# Patient Record
Sex: Male | Born: 1950
Health system: Southern US, Community
[De-identification: ages and names within clinical notes are randomized; demographics above are authoritative.]

## PROBLEM LIST (undated history)

## (undated) DIAGNOSIS — Z8619 Personal history of other infectious and parasitic diseases: Secondary | ICD-10-CM

## (undated) DIAGNOSIS — D580 Hereditary spherocytosis: Secondary | ICD-10-CM

## (undated) HISTORY — DX: Personal history of other infectious and parasitic diseases: Z86.19

## (undated) HISTORY — DX: Hereditary spherocytosis: D58.0

## (undated) HISTORY — PX: SPLENECTOMY: SUR1306

---

## 2005-04-13 HISTORY — PX: COLONOSCOPY: SHX174

## 2014-04-16 ENCOUNTER — Encounter: Payer: Self-pay | Admitting: Family Medicine

## 2014-04-16 ENCOUNTER — Ambulatory Visit (INDEPENDENT_AMBULATORY_CARE_PROVIDER_SITE_OTHER): Payer: Self-pay | Admitting: Family Medicine

## 2014-04-16 VITALS — BP 118/78 | HR 84 | Temp 97.7°F | Ht 67.75 in | Wt 170.5 lb

## 2014-04-16 DIAGNOSIS — D689 Coagulation defect, unspecified: Secondary | ICD-10-CM

## 2014-04-16 DIAGNOSIS — D580 Hereditary spherocytosis: Secondary | ICD-10-CM | POA: Insufficient documentation

## 2014-04-16 NOTE — Assessment & Plan Note (Signed)
Will review records when they arrive from prior PCP [facility] at [facility]

## 2014-04-16 NOTE — Patient Instructions (Addendum)
Sign release for records from [place] [facility] Good to meet you today, call us with questions. Return as needed or after September for next physical.

## 2014-04-16 NOTE — Progress Notes (Signed)
BP 118/78 mmHg  Pulse 84  Temp(Src) 97.7 F (36.5 C) (Oral)  Ht 5' 7.75" (1.721 m)  Wt 170 lb 8 oz (77.338 kg)  BMI 26.11 kg/m2   CC: new pt to establish  Subjective:    Patient ID: Dustin Cole, male    DOB: 06/29/1950, 64 y.o.   MRN: [id number]  HPI: Dustin Cole is a 64 y.o. male presenting on 04/16/2014 for Establish Care   Prior saw Dr Farrel Gordon at [place]. Comes to establish care today as this is closer to home.  H/o some hereditary clotting disorder s/p splenectomy as a teenager. ?hereditary spherocytosis - will await records. Denies bleeding troubles currently. Father's side with similar hx, brother with same dx also s/p splenectomy.  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 Interim medical history since our last visit reviewed. Allergies and medications reviewed and updated. No current outpatient prescriptions on file prior to visit.   No current facility-administered medications on file prior to visit.    Review of Systems  Constitutional: Negative for fever, chills, appetite change, fatigue and unexpected weight change.  Respiratory: Negative for cough, chest tightness, shortness of breath and wheezing.   Cardiovascular: Negative for chest pain, palpitations and leg swelling.   Per HPI unless specifically indicated above     Objective:    BP 118/78 mmHg  Pulse 84  Temp(Src) 97.7 F (36.5 C) (Oral)  Ht 5' 7.75" (1.721 m)  Wt 170 lb 8 oz (77.338 kg)  BMI 26.11 kg/m2  Wt Readings from Last 3 Encounters:  04/16/14 170 lb 8 oz (77.338 kg)    Physical Exam  Constitutional: He is oriented to person, place,  and time. He appears well-developed and well-nourished. No distress.  HENT:  Head: Normocephalic and atraumatic.  Right Ear: Hearing, tympanic membrane, external ear and ear canal normal.  Left Ear: Hearing, tympanic membrane, external ear and ear canal normal.  Nose: Nose normal.  Mouth/Throat: Uvula is midline, oropharynx is clear and moist and mucous membranes are normal. No oropharyngeal exudate, posterior oropharyngeal edema or posterior oropharyngeal erythema.  Eyes: Conjunctivae and EOM are normal. Pupils are equal, round, and reactive to light. No scleral icterus.  Neck: Normal range of motion. Neck supple.  Cardiovascular: Normal rate, regular rhythm, normal heart sounds and intact distal pulses.   No murmur heard. Pulses:      Radial pulses are 2+ on the right side, and 2+ on the left side.  Pulmonary/Chest: Effort normal and breath sounds normal. No respiratory distress. He has no wheezes. He has no rales.  Musculoskeletal: Normal range of motion. He exhibits no edema.  Lymphadenopathy:    He has no cervical adenopathy.  Neurological: He is alert and oriented to person, place, and time.  CN grossly intact, station and gait intact  Skin: Skin is warm and dry. No rash noted.  Psychiatric: He has a normal mood and affect. His behavior is normal. Judgment and thought content normal.  Nursing note and vitals reviewed.  No results found for this or any previous visit.    Assessment & Plan:  Discussed meds/supplements, rec against extra calcium supplementation, discussed dietary calcium supplementation.  Problem List  Items Addressed This Visit    Blood clotting disorder - Primary    Will review records when they arrive from prior PCP [facility] at [facility]        Follow up plan: Return in about 9 months (around 01/15/2015), or as needed, for annual exam, prior fasting for blood work.

## 2014-04-16 NOTE — Progress Notes (Signed)
Pre visit review using our clinic review tool, if applicable. No additional management support is needed unless otherwise documented below in the visit note. 

## 2014-07-17 ENCOUNTER — Telehealth: Payer: Self-pay | Admitting: Family Medicine

## 2014-07-17 NOTE — Telephone Encounter (Signed)
Pt needs to have TB test done for employer because he works as an outside Chief Strategy Officer for [employer].

## 2014-07-17 NOTE — Telephone Encounter (Signed)
Ok to do. Thanks.  

## 2014-07-18 NOTE — Telephone Encounter (Signed)
Patient notified and appt had already been scheduled.

## 2014-07-27 ENCOUNTER — Ambulatory Visit (INDEPENDENT_AMBULATORY_CARE_PROVIDER_SITE_OTHER): Payer: Self-pay

## 2014-07-27 DIAGNOSIS — A184 Tuberculosis of skin and subcutaneous tissue: Secondary | ICD-10-CM

## 2014-07-30 LAB — TB SKIN TEST: TB Skin Test: NEGATIVE

## 2015-01-11 ENCOUNTER — Other Ambulatory Visit: Payer: Self-pay

## 2015-01-18 ENCOUNTER — Encounter: Payer: Self-pay | Admitting: Family Medicine

## 2015-04-19 ENCOUNTER — Other Ambulatory Visit: Payer: Self-pay | Admitting: Family Medicine

## 2015-04-19 ENCOUNTER — Other Ambulatory Visit (INDEPENDENT_AMBULATORY_CARE_PROVIDER_SITE_OTHER): Payer: Self-pay

## 2015-04-19 DIAGNOSIS — D689 Coagulation defect, unspecified: Secondary | ICD-10-CM

## 2015-04-19 DIAGNOSIS — Z13220 Encounter for screening for lipoid disorders: Secondary | ICD-10-CM

## 2015-04-19 LAB — CBC WITH DIFFERENTIAL/PLATELET
BASOS ABS: 0 10*3/uL (ref 0.0–0.1)
Basophils Relative: 0.4 % (ref 0.0–3.0)
EOS PCT: 3.9 % (ref 0.0–5.0)
Eosinophils Absolute: 0.4 10*3/uL (ref 0.0–0.7)
HCT: 47.9 % (ref 39.0–52.0)
HEMOGLOBIN: 16.8 g/dL (ref 13.0–17.0)
Lymphocytes Relative: 18.6 % (ref 12.0–46.0)
Lymphs Abs: 1.8 10*3/uL (ref 0.7–4.0)
MCHC: 35 g/dL (ref 30.0–36.0)
MCV: 85.7 fl (ref 78.0–100.0)
MONO ABS: 1 10*3/uL (ref 0.1–1.0)
MONOS PCT: 10.6 % (ref 3.0–12.0)
NEUTROS PCT: 66.5 % (ref 43.0–77.0)
Neutro Abs: 6.4 10*3/uL (ref 1.4–7.7)
Platelets: 364 10*3/uL (ref 150.0–400.0)
RBC: 5.59 Mil/uL (ref 4.22–5.81)
RDW: 14.1 % (ref 11.5–15.5)
WBC: 9.7 10*3/uL (ref 4.0–10.5)

## 2015-04-19 LAB — COMPREHENSIVE METABOLIC PANEL
ALT: 25 U/L (ref 0–53)
AST: 23 U/L (ref 0–37)
Albumin: 4.1 g/dL (ref 3.5–5.2)
Alkaline Phosphatase: 65 U/L (ref 39–117)
BUN: 11 mg/dL (ref 6–23)
CALCIUM: 9.1 mg/dL (ref 8.4–10.5)
CHLORIDE: 101 meq/L (ref 96–112)
CO2: 29 meq/L (ref 19–32)
Creatinine, Ser: 0.96 mg/dL (ref 0.40–1.50)
GFR: 83.95 mL/min (ref >60.00)
Glucose, Bld: 90 mg/dL (ref 70–99)
Potassium: 3.9 mEq/L (ref 3.5–5.1)
Sodium: 139 mEq/L (ref 135–145)
Total Bilirubin: 1.3 mg/dL — ABNORMAL HIGH (ref 0.2–1.2)
Total Protein: 6.7 g/dL (ref 6.0–8.3)

## 2015-04-19 LAB — LIPID PANEL
CHOL/HDL RATIO: 5
Cholesterol: 196 mg/dL (ref 0–200)
HDL: 41 mg/dL (ref >39.00)
LDL CALC: 136 mg/dL — AB (ref 0–99)
NonHDL: 154.87
Triglycerides: 95 mg/dL (ref 0.0–149.0)
VLDL: 19 mg/dL (ref 0.0–40.0)

## 2015-04-19 LAB — TSH: TSH: 1.27 u[IU]/mL (ref 0.35–4.50)

## 2015-04-26 ENCOUNTER — Encounter: Payer: Self-pay | Admitting: Family Medicine

## 2015-04-26 ENCOUNTER — Ambulatory Visit (INDEPENDENT_AMBULATORY_CARE_PROVIDER_SITE_OTHER): Payer: Self-pay | Admitting: Family Medicine

## 2015-04-26 VITALS — BP 118/82 | HR 88 | Temp 97.7°F | Ht 67.75 in | Wt 174.2 lb

## 2015-04-26 DIAGNOSIS — L409 Psoriasis, unspecified: Secondary | ICD-10-CM | POA: Insufficient documentation

## 2015-04-26 DIAGNOSIS — Z0000 Encounter for general adult medical examination without abnormal findings: Secondary | ICD-10-CM

## 2015-04-26 DIAGNOSIS — R21 Rash and other nonspecific skin eruption: Secondary | ICD-10-CM

## 2015-04-26 DIAGNOSIS — Z125 Encounter for screening for malignant neoplasm of prostate: Secondary | ICD-10-CM

## 2015-04-26 LAB — PSA: PSA: 1.54 ng/mL (ref 0.10–4.00)

## 2015-04-26 MED ORDER — MUPIROCIN CALCIUM 2 % EX CREA: 1.0000 "application " | TOPICAL_CREAM | Freq: Two times a day (BID) | CUTANEOUS | Status: DC

## 2015-04-26 MED ORDER — TRIAMCINOLONE ACETONIDE 0.1 % EX CREA: 1.0000 "application " | TOPICAL_CREAM | Freq: Two times a day (BID) | CUTANEOUS | Status: DC

## 2015-04-26 NOTE — Assessment & Plan Note (Signed)
?  impetigo vs poison ivy dermatitis - treat with mupirocin cream and if no improvement, provided WASP for TCI cream.

## 2015-04-26 NOTE — Assessment & Plan Note (Signed)
Preventative protocols reviewed and updated unless pt declined. Discussed healthy diet and lifestyle.  

## 2015-04-26 NOTE — Progress Notes (Signed)
BP 118/82 mmHg  Pulse 88  Temp(Src) 97.7 F (36.5 C) (Oral)  Ht 5' 7.75" (1.721 m)  Wt 174 lb 4 oz (79.039 kg)  BMI 26.69 kg/m2   CC: CPE  Subjective:    Patient ID: Dustin Cole, male    DOB: [birth year], 65 y.o.   MRN: [id number]  HPI: Dustin Cole is a 65 y.o. male presenting on 04/26/2015 for Annual Exam   Sore on L inner leg at medial malleolus present for last 2-3 weeks. Not tender or itchy. No other sores or rashes on skin.   Preventative: Colonoscopy - 5-7 yrs ago Prostate cancer screening - yearly.  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 Interim medical history since our last visit reviewed. Allergies and medications reviewed and updated. Current Outpatient Prescriptions on File Prior to Visit  Medication Sig  . aspirin 81 MG tablet Take 81 mg by mouth daily.  . Multiple Vitamin (MULTIVITAMIN) tablet Take 1 tablet by mouth daily.  . Multiple Vitamins-Minerals (PRESERVISION AREDS PO) Take by mouth daily.   No current facility-administered medications on file prior to visit.    Review of Systems  Constitutional: Negative for fever, chills, activity change, appetite change, fatigue and unexpected weight change.  HENT: Negative for hearing loss.   Eyes: Negative for visual disturbance.  Respiratory: Negative for cough, chest tightness, shortness of breath and wheezing.   Cardiovascular: Negative for chest pain, palpitations and leg swelling.  Gastrointestinal: Negative for nausea, vomiting, abdominal pain, diarrhea, constipation, blood in stool and abdominal distention.    Genitourinary: Negative for hematuria and difficulty urinating.  Musculoskeletal: Negative for myalgias, arthralgias and neck pain.  Skin: Negative for rash.  Neurological: Negative for dizziness, seizures, syncope and headaches.  Hematological: Negative for adenopathy. Does not bruise/bleed easily.  Psychiatric/Behavioral: Negative for dysphoric mood. The patient is not nervous/anxious.    Per HPI unless specifically indicated in ROS section     Objective:    BP 118/82 mmHg  Pulse 88  Temp(Src) 97.7 F (36.5 C) (Oral)  Ht 5' 7.75" (1.721 m)  Wt 174 lb 4 oz (79.039 kg)  BMI 26.69 kg/m2  Wt Readings from Last 3 Encounters:  04/26/15 174 lb 4 oz (79.039 kg)  04/16/14 170 lb 8 oz (77.338 kg)    Physical Exam  Constitutional: He is oriented to person, place, and time. He appears well-developed and well-nourished. No distress.  HENT:  Head: Normocephalic and atraumatic.  Right Ear: Hearing, tympanic membrane, external ear and ear canal normal.  Left Ear: Hearing, tympanic membrane, external ear and ear canal normal.  Nose: Nose normal.  Mouth/Throat: Uvula is midline, oropharynx is clear and moist and mucous membranes are normal. No oropharyngeal exudate, posterior oropharyngeal edema or posterior oropharyngeal erythema.  Eyes: Conjunctivae and EOM are normal. Pupils are equal, round, and reactive to light. No scleral icterus.  Neck: Normal range of motion. Neck supple. Carotid bruit is not present. No thyromegaly present.  Cardiovascular: Normal rate, regular rhythm, normal heart sounds and intact distal pulses.   No murmur heard. Pulses:      Radial pulses are 2+ on the right side,  and 2+ on the left side.  Pulmonary/Chest: Effort normal and breath sounds normal. No respiratory distress. He has no wheezes. He has no rales.  Abdominal: Soft. Bowel sounds are normal. He exhibits no distension and no mass. There is no tenderness. There is no rebound and no guarding.  Genitourinary:  Rectum normal and prostate normal. Rectal exam shows no external hemorrhoid, no internal hemorrhoid, no fissure, no mass, no tenderness and anal tone normal. Prostate is not enlarged (20gm) and not tender.  Musculoskeletal: Normal range of motion. He exhibits no edema.  Lymphadenopathy:    He has no cervical adenopathy.  Neurological: He is alert and oriented to person, place, and time.  CN grossly intact, station and gait intact  Skin: Skin is warm and dry. Rash noted.  L medial malleolus and R lower leg with patch of erythematous but blanching macules, some honey colored crusting and minimal serous drainage present  Psychiatric: He has a normal mood and affect. His behavior is normal. Judgment and thought content normal.  Nursing note and vitals reviewed.  Results for orders placed or performed in visit on 04/19/15  Lipid panel  Result Value Ref Range   Cholesterol 196 0 - 200 mg/dL   Triglycerides 95.0 0.0 - 149.0 mg/dL   HDL 41.00 >39.00 mg/dL   VLDL 19.0 0.0 - 40.0 mg/dL   LDL Cholesterol 136 (H) 0 - 99 mg/dL   Total CHOL/HDL Ratio 5    NonHDL 154.87   Comprehensive metabolic panel  Result Value Ref Range   Sodium 139 135 - 145 mEq/L   Potassium 3.9 3.5 - 5.1 mEq/L   Chloride 101 96 - 112 mEq/L   CO2 29 19 - 32 mEq/L   Glucose, Bld 90 70 - 99 mg/dL   BUN 11 6 - 23 mg/dL   Creatinine, Ser 0.96 0.40 - 1.50 mg/dL   Total Bilirubin 1.3 (H) 0.2 - 1.2 mg/dL   Alkaline Phosphatase 65 39 - 117 U/L   AST 23 0 - 37 U/L   ALT 25 0 - 53 U/L   Total Protein 6.7 6.0 - 8.3 g/dL   Albumin 4.1 3.5 - 5.2 g/dL   Calcium 9.1 8.4 - 10.5 mg/dL   GFR 83.95 >60.00 mL/min  TSH  Result Value Ref Range   TSH 1.27 0.35 - 4.50 uIU/mL  CBC with Differential/Platelet  Result Value Ref Range   WBC 9.7 4.0 - 10.5 K/uL   RBC 5.59 4.22 - 5.81 Mil/uL   Hemoglobin 16.8 13.0 - 17.0 g/dL   HCT 47.9 39.0 - 52.0 %   MCV 85.7 78.0 - 100.0 fl   MCHC 35.0 30.0 - 36.0 g/dL   RDW 14.1 11.5 - 15.5 %    Platelets 364.0 150.0 - 400.0 K/uL   Neutrophils Relative % 66.5 43.0 - 77.0 %   Lymphocytes Relative 18.6 12.0 - 46.0 %   Monocytes Relative 10.6 3.0 - 12.0 %   Eosinophils Relative 3.9 0.0 - 5.0 %   Basophils Relative 0.4 0.0 - 3.0 %   Neutro Abs 6.4 1.4 - 7.7 K/uL   Lymphs Abs 1.8 0.7 - 4.0 K/uL   Monocytes Absolute 1.0 0.1 - 1.0 K/uL   Eosinophils Absolute 0.4 0.0 - 0.7 K/uL   Basophils Absolute 0.0 0.0 - 0.1 K/uL      Assessment & Plan:   Problem List Items Addressed This Visit    Skin rash    ?impetigo vs poison ivy dermatitis - treat with mupirocin cream  and if no improvement, provided WASP for TCI cream.       Health care maintenance - Primary    Preventative protocols reviewed and updated unless pt declined. Discussed healthy diet and lifestyle.        Other Visit Diagnoses    Special screening for malignant neoplasm of prostate        Relevant Orders    PSA        Follow up plan: Return in about 1 year (around 04/25/2016), or as needed, for annual exam, prior fasting for blood work.

## 2015-04-26 NOTE — Patient Instructions (Addendum)
Sign release for records from [facility] by lab for prostate blood test Try mupirocin cream sent to pharmacy for skin rash. If no improvement after 1 week, start steroid cream (prescription printed out today).   Health Maintenance, Male A healthy lifestyle and preventative care can promote health and wellness.  Maintain regular health, dental, and eye exams.  Eat a healthy diet. Foods like vegetables, fruits, whole grains, low-fat dairy products, and lean protein foods contain the nutrients you need and are low in calories. Decrease your intake of foods high in solid fats, added sugars, and salt. Get information about a proper diet from your health care provider, if necessary.  Regular physical exercise is one of the most important things you can do for your health. Most adults should get at least 150 minutes of moderate-intensity exercise (any activity that increases your heart rate and causes you to sweat) each week. In addition, most adults need muscle-strengthening exercises on 2 or more days a week.   Maintain a healthy weight. The body mass index (BMI) is a screening tool to identify possible weight problems. It provides an estimate of body fat based on height and weight. Your health care provider can find your BMI and can help you achieve or maintain a healthy weight. For males 20 years and older:  A BMI below 18.5 is considered underweight.  A BMI of 18.5 to 24.9 is normal.  A BMI of 25 to 29.9 is considered overweight.  A BMI of 30 and above is considered obese.  Maintain normal blood lipids and cholesterol by exercising and minimizing your intake of saturated fat. Eat a balanced diet with plenty of fruits and vegetables. Blood tests for lipids and cholesterol should begin at age 98 and be repeated every 5 years. If your lipid or cholesterol levels are high, you are over age 26, or you are at high risk for heart disease, you may need your cholesterol levels checked more  frequently.Ongoing high lipid and cholesterol levels should be treated with medicines if diet and exercise are not working.  If you smoke, find out from your health care provider how to quit. If you do not use tobacco, do not start.  Lung cancer screening is recommended for adults aged 60-80 years who are at high risk for developing lung cancer because of a history of smoking. A yearly low-dose CT scan of the lungs is recommended for people who have at least a 30-pack-year history of smoking and are current smokers or have quit within the past 15 years. A pack year of smoking is smoking an average of 1 pack of cigarettes a day for 1 year (for example, a 30-pack-year history of smoking could mean smoking 1 pack a day for 30 years or 2 packs a day for 15 years). Yearly screening should continue until the smoker has stopped smoking for at least 15 years. Yearly screening should be stopped for people who develop a health problem that would prevent them from having lung cancer treatment.  If you choose to drink alcohol, do not have more than 2 drinks per day. One drink is considered to be 12 oz (360 mL) of beer, 5 oz (150 mL) of wine, or 1.5 oz (45 mL) of liquor.  Avoid the use of street drugs. Do not share needles with anyone. Ask for help if you need support or instructions about stopping the use of drugs.  High blood pressure causes heart disease and increases the risk of stroke.  High blood pressure is more likely to develop in:  People who have blood pressure in the end of the normal range (100-139/85-89 mm Hg).  People who are overweight or obese.  People who are African American.  If you are 70-35 years of age, have your blood pressure checked every 3-5 years. If you are 33 years of age or older, have your blood pressure checked every year. You should have your blood pressure measured twice--once when you are at a hospital or clinic, and once when you are not at a hospital or clinic. Record the  average of the two measurements. To check your blood pressure when you are not at a hospital or clinic, you can use:  An automated blood pressure machine at a pharmacy.  A home blood pressure monitor.  If you are 41-10 years old, ask your health care provider if you should take aspirin to prevent heart disease.  Diabetes screening involves taking a blood sample to check your fasting blood sugar level. This should be done once every 3 years after age 46 if you are at a normal weight and without risk factors for diabetes. Testing should be considered at a younger age or be carried out more frequently if you are overweight and have at least 1 risk factor for diabetes.  Colorectal cancer can be detected and often prevented. Most routine colorectal cancer screening begins at the age of 10 and continues through age 70. However, your health care provider may recommend screening at an earlier age if you have risk factors for colon cancer. On a yearly basis, your health care provider may provide home test kits to check for hidden blood in the stool. A small camera at the end of a tube may be used to directly examine the colon (sigmoidoscopy or colonoscopy) to detect the earliest forms of colorectal cancer. Talk to your health care provider about this at age 59 when routine screening begins. A direct exam of the colon should be repeated every 5-10 years through age 40, unless early forms of precancerous polyps or small growths are found.  People who are at an increased risk for hepatitis B should be screened for this virus. You are considered at high risk for hepatitis B if:  You were born in a country where hepatitis B occurs often. Talk with your health care provider about which countries are considered high risk.  Your parents were born in a high-risk country and you have not received a shot to protect against hepatitis B (hepatitis B vaccine).  You have HIV or AIDS.  You use needles to inject street  drugs.  You live with, or have sex with, someone who has hepatitis B.  You are a man who has sex with other men (MSM).  You get hemodialysis treatment.  You take certain medicines for conditions like cancer, organ transplantation, and autoimmune conditions.  Hepatitis C blood testing is recommended for all people born from [birth year] through 1964 and any individual with known risk factors for hepatitis C.  Healthy men should no longer receive prostate-specific antigen (PSA) blood tests as part of routine cancer screening. Talk to your health care provider about prostate cancer screening.  Testicular cancer screening is not recommended for adolescents or adult males who have no symptoms. Screening includes self-exam, a health care provider exam, and other screening tests. Consult with your health care provider about any symptoms you have or any concerns you have about testicular cancer.  Practice safe sex. Use condoms  and avoid high-risk sexual practices to reduce the spread of sexually transmitted infections (STIs).  You should be screened for STIs, including gonorrhea and chlamydia if:  You are sexually active and are younger than 24 years.  You are older than 24 years, and your health care provider tells you that you are at risk for this type of infection.  Your sexual activity has changed since you were last screened, and you are at an increased risk for chlamydia or gonorrhea. Ask your health care provider if you are at risk.  If you are at risk of being infected with HIV, it is recommended that you take a prescription medicine daily to prevent HIV infection. This is called pre-exposure prophylaxis (PrEP). You are considered at risk if:  You are a man who has sex with other men (MSM).  You are a heterosexual man who is sexually active with multiple partners.  You take drugs by injection.  You are sexually active with a partner who has HIV.  Talk with your health care provider about  whether you are at high risk of being infected with HIV. If you choose to begin PrEP, you should first be tested for HIV. You should then be tested every 3 months for as long as you are taking PrEP.  Use sunscreen. Apply sunscreen liberally and repeatedly throughout the day. You should seek shade when your shadow is shorter than you. Protect yourself by wearing long sleeves, pants, a wide-brimmed hat, and sunglasses year round whenever you are outdoors.  Tell your health care provider of new moles or changes in moles, especially if there is a change in shape or color. Also, tell your health care provider if a mole is larger than the size of a pencil eraser.  A one-time screening for abdominal aortic aneurysm (AAA) and surgical repair of large AAAs by ultrasound is recommended for men aged 11-75 years who are current or former smokers.  Stay current with your vaccines (immunizations).   This information is not intended to replace advice given to you by your health care provider. Make sure you discuss any questions you have with your health care provider.   Document Released: 09/26/2007 Document Revised: 04/20/2014 Document Reviewed: 08/25/2010 Elsevier Interactive Patient Education [employer].

## 2015-04-26 NOTE — Progress Notes (Signed)
Pre visit review using our clinic review tool, if applicable. No additional management support is needed unless otherwise documented below in the visit note. 

## 2015-04-29 ENCOUNTER — Encounter: Payer: Self-pay | Admitting: *Deleted

## 2015-05-21 ENCOUNTER — Encounter: Payer: Self-pay | Admitting: Family Medicine

## 2015-05-21 ENCOUNTER — Ambulatory Visit (INDEPENDENT_AMBULATORY_CARE_PROVIDER_SITE_OTHER): Payer: Self-pay | Admitting: Family Medicine

## 2015-05-21 VITALS — BP 118/82 | HR 88 | Temp 97.4°F | Wt 169.5 lb

## 2015-05-21 DIAGNOSIS — R21 Rash and other nonspecific skin eruption: Secondary | ICD-10-CM

## 2015-05-21 MED ORDER — CLINDAMYCIN HCL 300 MG PO CAPS: 300.0000 mg | ORAL_CAPSULE | Freq: Three times a day (TID) | ORAL | Status: DC

## 2015-05-21 MED ORDER — TRIAMCINOLONE ACETONIDE 0.1 % EX CREA: 1.0000 "application " | TOPICAL_CREAM | Freq: Two times a day (BID) | CUTANEOUS | Status: DC

## 2015-05-21 NOTE — Progress Notes (Signed)
   BP 118/82 mmHg  Pulse 88  Temp(Src) 97.4 F (36.3 C) (Oral)  Wt 169 lb 8 oz (76.885 kg)   CC: recheck rash  Subjective:    Patient ID: Dustin Cole, male    DOB: 1950/12/08, 65 y.o.   MRN: [id number]  HPI: Dustin Cole is a 65 y.o. male presenting on 05/21/2015 for Rash   Rash on L inner leg at medial malleolus and some R lorew leg present for last 1.5 months. At eval last visit thought impetigo vs poison ivy dermatitis - treated with mupirocin cream. Initially improved then last week deteriorated.   Never tried TCI cream.   He did find he may have been exposed to poison oak   Relevant past medical, surgical, family and social history reviewed and updated as indicated. Interim medical history since our last visit reviewed. Allergies and medications reviewed and updated. Current Outpatient Prescriptions on File Prior to Visit  Medication Sig  . aspirin 81 MG tablet Take 81 mg by mouth daily.  . Multiple Vitamin (MULTIVITAMIN) tablet Take 1 tablet by mouth daily.  . Multiple Vitamins-Minerals (PRESERVISION AREDS PO) Take by mouth daily.   No current facility-administered medications on file prior to visit.    Review of Systems Per HPI unless specifically indicated in ROS section     Objective:    BP 118/82 mmHg  Pulse 88  Temp(Src) 97.4 F (36.3 C) (Oral)  Wt 169 lb 8 oz (76.885 kg)  Wt Readings from Last 3 Encounters:  05/21/15 169 lb 8 oz (76.885 kg)  04/26/15 174 lb 4 oz (79.039 kg)  04/16/14 170 lb 8 oz (77.338 kg)    Physical Exam  Constitutional: He appears well-developed and well-nourished. No distress.  Skin: Skin is warm and dry. Rash noted. There is erythema.     Bilateral lower legs with erythematous dry patches with honey colored crusting, not pruritic or tender  Nursing note and vitals reviewed.     Assessment & Plan:   Problem List Items Addressed This Visit    Skin rash - Primary    Anticipate poison oak dermatitis with secondary  superinfection - treat with oral clindamycin and TCI topically. Update if not improved with treatment, consider derm referral.          Follow up plan: Return if symptoms worsen or fail to improve.

## 2015-05-21 NOTE — Assessment & Plan Note (Signed)
Anticipate poison oak dermatitis with secondary superinfection - treat with oral clindamycin and TCI topically. Update if not improved with treatment, consider derm referral.

## 2015-05-21 NOTE — Patient Instructions (Signed)
I think this is infection of initial poison oak. Treat with clindamycin antibiotic three times daily for 1 week and start triamcinolone steroid cream to rash twice daily for next week. Let us know if not better with this and we will refer you to skin doctor.

## 2015-05-21 NOTE — Progress Notes (Signed)
Pre visit review using our clinic review tool, if applicable. No additional management support is needed unless otherwise documented below in the visit note. 

## 2015-05-30 ENCOUNTER — Telehealth: Payer: Self-pay | Admitting: Family Medicine

## 2015-05-30 NOTE — Telephone Encounter (Signed)
Spoke with patient. He said rash improved slightly but came back after he stopped taking abx and using cream. He is requesting refill bc he is going to be out of town next week to work.

## 2015-05-30 NOTE — Telephone Encounter (Signed)
Pt came in today needing to get a refill on Clindamycin 300mg  cap act triamcinolon .01%cre fougera   [facility] [address]

## 2015-05-31 MED ORDER — TRIAMCINOLONE ACETONIDE 0.1 % EX CREA: 1.0000 "application " | TOPICAL_CREAM | Freq: Two times a day (BID) | CUTANEOUS | Status: DC

## 2015-05-31 MED ORDER — CLINDAMYCIN HCL 300 MG PO CAPS: 300.0000 mg | ORAL_CAPSULE | Freq: Three times a day (TID) | ORAL | Status: DC

## 2015-05-31 NOTE — Telephone Encounter (Signed)
Patient notified and verbalized understanding. 

## 2015-05-31 NOTE — Telephone Encounter (Signed)
Will do 1 more week clinda. Continue triamcinolone cream for 2 full weeks then stop.  If persistent rash let us know for derm referral.  If any diarrhea on clindamycin needs to stop right away and let us know.

## 2015-06-10 ENCOUNTER — Telehealth: Payer: Self-pay | Admitting: Family Medicine

## 2015-06-10 DIAGNOSIS — R21 Rash and other nonspecific skin eruption: Secondary | ICD-10-CM

## 2015-06-10 NOTE — Telephone Encounter (Signed)
Pt stopped by office. Would like to proceed with derm referral for rash. Would like [place] but does not mind [place]. Please advise.

## 2015-06-10 NOTE — Telephone Encounter (Signed)
Referral placed.

## 2015-07-19 ENCOUNTER — Telehealth: Payer: Self-pay

## 2015-07-19 NOTE — Telephone Encounter (Signed)
Appointment scheduled.  Patient aware. 

## 2015-07-19 NOTE — Telephone Encounter (Signed)
Dustin Cole stop by and stated that he needs a TB test for his job, and would like to get that.

## 2015-07-19 NOTE — Telephone Encounter (Signed)
That's fine to schedule nurse visit for any day other than a Thursday. Please remind him to bring any needed paperwork. Thanks!

## 2015-07-23 ENCOUNTER — Ambulatory Visit (INDEPENDENT_AMBULATORY_CARE_PROVIDER_SITE_OTHER): Payer: Self-pay

## 2015-07-23 DIAGNOSIS — Z111 Encounter for screening for respiratory tuberculosis: Secondary | ICD-10-CM

## 2015-07-25 LAB — TB SKIN TEST: TB Skin Test: NEGATIVE

## 2015-08-23 ENCOUNTER — Ambulatory Visit (INDEPENDENT_AMBULATORY_CARE_PROVIDER_SITE_OTHER): Payer: Self-pay | Admitting: Family Medicine

## 2015-08-23 ENCOUNTER — Encounter: Payer: Self-pay | Admitting: Family Medicine

## 2015-08-23 VITALS — BP 126/84 | HR 104 | Temp 97.7°F | Wt 168.5 lb

## 2015-08-23 DIAGNOSIS — N521 Erectile dysfunction due to diseases classified elsewhere: Secondary | ICD-10-CM

## 2015-08-23 DIAGNOSIS — L409 Psoriasis, unspecified: Secondary | ICD-10-CM

## 2015-08-23 DIAGNOSIS — N529 Male erectile dysfunction, unspecified: Secondary | ICD-10-CM | POA: Insufficient documentation

## 2015-08-23 MED ORDER — SILDENAFIL CITRATE 100 MG PO TABS: 50.0000 mg | ORAL_TABLET | Freq: Every day | ORAL | Status: DC | PRN

## 2015-08-23 NOTE — Progress Notes (Addendum)
BP 126/84 mmHg  Pulse 104  Temp(Src) 97.7 F (36.5 C) (Oral)  Wt 168 lb 8 oz (76.431 kg)   CC: discuss ED  Subjective:    Patient ID: Dustin Cole, male    DOB: 18-Dec-1950, 65 y.o.   MRN: [id number]  HPI: Dustin Cole is a 65 y.o. male presenting on 08/23/2015 for Erectile Dysfunction   Self pay patient.  Skin rash thought impetigo, no improvement with treatment so referred to derm - Dustin Cole - thought psoriasis, treated with cetaphil mix with clobetasol and bleach bath 3x/wk. Skin improving.   Noticed over last 4-5 months more trouble obtaining and maintaining erection.   Denies urethral discharge, fevers, dysuria, abd pain, chest pain or dyspnea. Stays active at work and walking track - no dyspnea with this. No change in libido. No new stressors or life changes.   Relevant past medical, surgical, family and social history reviewed and updated as indicated. Interim medical history since our last visit reviewed. Allergies and medications reviewed and updated. Current Outpatient Prescriptions on File Prior to Visit  Medication Sig  . aspirin 81 MG tablet Take 81 mg by mouth daily.  . Multiple Vitamin (MULTIVITAMIN) tablet Take 1 tablet by mouth daily.  . Multiple Vitamins-Minerals (PRESERVISION AREDS PO) Take by mouth daily.   No current facility-administered medications on file prior to visit.    Review of Systems Per HPI unless specifically indicated in ROS section     Objective:    BP 126/84 mmHg  Pulse 104  Temp(Src) 97.7 F (36.5 C) (Oral)  Wt 168 lb 8 oz (76.431 kg)  Wt Readings from Last 3 Encounters:  08/23/15 168 lb 8 oz (76.431 kg)  05/21/15 169 lb 8 oz (76.885 kg)  04/26/15 174 lb 4 oz (79.039 kg)    Physical Exam  Constitutional: He appears well-developed and well-nourished. No distress.  HENT:  Mouth/Throat: Oropharynx is clear and moist. No oropharyngeal exudate.  Neck: Carotid bruit is not present.  Cardiovascular: Normal rate,  regular rhythm, normal heart sounds and intact distal pulses.   No murmur heard. Pulmonary/Chest: Effort normal and breath sounds normal. No respiratory distress. He has no wheezes. He has no rales.  Abdominal: Hernia confirmed negative in the right inguinal area and confirmed negative in the left inguinal area.  Genitourinary: Testes normal and penis normal. Right testis shows no mass, no swelling and no tenderness. Right testis is descended. Left testis shows no mass, no swelling and no tenderness. Left testis is descended. Circumcised.  Musculoskeletal: He exhibits no edema.  Lymphadenopathy:    He has no cervical adenopathy.       Right: No inguinal adenopathy present.       Left: No inguinal adenopathy present.  Nursing note and vitals reviewed.     Assessment & Plan:   Problem List Items Addressed This Visit    Psoriasis    Saw [facility] 2017 - on cetaphil/clobetasol      Erectile dysfunction - Primary    Discussed organic vs psychogenic ED. Anticipate organic.  Trial viagra - discussed headache, priapism, need to avoid any form of nitro. Advised update [place] if any chest pain/dyspnea for further evaluation. Baseline EKG today - NSR rate 90, normal axis, intervals, no acute ST/T changes, LAA with p mitrale      Relevant Orders   EKG 12-Lead (Completed)       Follow up plan: Return if symptoms worsen or fail to improve.  Garlon Hatchet  Danise Mina, MD

## 2015-08-23 NOTE — Assessment & Plan Note (Addendum)
Discussed organic vs psychogenic ED. Anticipate organic.  Trial viagra - discussed headache, priapism, need to avoid any form of nitro. Advised update [place] if any chest pain/dyspnea for further evaluation. Baseline EKG today - NSR rate 90, normal axis, intervals, no acute ST/T changes, LAA with p mitrale

## 2015-08-23 NOTE — Progress Notes (Signed)
Pre visit review using our clinic review tool, if applicable. No additional management support is needed unless otherwise documented below in the visit note. 

## 2015-08-23 NOTE — Assessment & Plan Note (Signed)
Saw [facility] 2017 - on cetaphil/clobetasol

## 2015-08-23 NOTE — Patient Instructions (Signed)
Baseline EKG today Trial viagra 1/2-1 tablet as needed - medicine sent to pharmacy Coupon provided today.  Erectile Dysfunction Erectile dysfunction is the inability to get or sustain a good enough erection to have sexual intercourse. Erectile dysfunction may involve:  Inability to get an erection.  Lack of enough hardness to allow penetration.  Loss of the erection before sex is finished.  Premature ejaculation. CAUSES  Certain drugs, such as:  Pain relievers.  Antihistamines.  Antidepressants.  Blood pressure medicines.  Water pills (diuretics).  Ulcer medicines.  Muscle relaxants.  Illegal drugs.  Excessive drinking.  Psychological causes, such as:  Anxiety.  Depression.  Sadness.  Exhaustion.  Performance fear.  Stress.  Physical causes, such as:  Artery problems. This may include diabetes, smoking, liver disease, or atherosclerosis.  High blood pressure.  Hormonal problems, such as low testosterone.  Obesity.  Nerve problems. This may include back or pelvic injuries, diabetes mellitus, multiple sclerosis, or Parkinson disease. SYMPTOMS  Inability to get an erection.  Lack of enough hardness to allow penetration.  Loss of the erection before sex is finished.  Premature ejaculation.  Normal erections at some times, but with frequent unsatisfactory episodes.  Orgasms that are not satisfactory in sensation or frequency.  Low sexual satisfaction in either partner because of erection problems.  A curved penis occurring with erection. The curve may cause pain or may be too curved to allow for intercourse.  Never having nighttime erections. DIAGNOSIS Your caregiver can often diagnose this condition by:  Performing a physical exam to find other diseases or specific problems with the penis.  Asking you detailed questions about the problem.  Performing blood tests to check for diabetes mellitus or to measure hormone  levels.  Performing urine tests to find other underlying health conditions.  Performing an ultrasound exam to check for scarring.  Performing a test to check blood flow to the penis.  Doing a sleep study at home to measure nighttime erections. TREATMENT   You may be prescribed medicines by mouth.  You may be given medicine injections into the penis.  You may be prescribed a vacuum pump with a ring.  Penile implant surgery may be performed. You may receive:  An inflatable implant.  A semirigid implant.  Blood vessel surgery may be performed. HOME CARE INSTRUCTIONS  If you are prescribed oral medicine, you should take the medicine as prescribed. Do not increase the dosage without first discussing it with your physician.  If you are using self-injections, be careful to avoid any veins that are on the surface of the penis. Apply pressure to the injection site for 5 minutes.  If you are using a vacuum pump, make sure you have read the instructions before using it. Discuss any questions with your physician before taking the pump home. SEEK MEDICAL CARE IF:  You experience pain that is not responsive to the pain medicine you have been prescribed.  You experience nausea or vomiting. SEEK IMMEDIATE MEDICAL CARE IF:   When taking oral or injectable medications, you experience an erection that lasts longer than 4 hours. If your physician is unavailable, go to the nearest emergency room for evaluation. An erection that lasts much longer than 4 hours can result in permanent damage to your penis.  You have pain that is severe.  You develop redness, severe pain, or severe swelling of your penis.  You have redness spreading up into your groin or lower abdomen.  You are unable to pass your urine.  This information is not intended to replace advice given to you by your health care provider. Make sure you discuss any questions you have with your health care provider.   Document  Released: 03/27/2000 Document Revised: 11/30/2012 Document Reviewed: 09/01/2012 Elsevier Interactive Patient Education [employer].

## 2015-11-14 ENCOUNTER — Ambulatory Visit (INDEPENDENT_AMBULATORY_CARE_PROVIDER_SITE_OTHER): Payer: Self-pay | Admitting: Family Medicine

## 2015-11-14 ENCOUNTER — Encounter: Payer: Self-pay | Admitting: Family Medicine

## 2015-11-14 DIAGNOSIS — M25512 Pain in left shoulder: Secondary | ICD-10-CM

## 2015-11-14 MED ORDER — NAPROXEN 500 MG PO TABS: ORAL_TABLET | ORAL | 0 refills | Status: DC

## 2015-11-14 NOTE — Progress Notes (Signed)
Pre visit review using our clinic review tool, if applicable. No additional management support is needed unless otherwise documented below in the visit note. 

## 2015-11-14 NOTE — Patient Instructions (Signed)
I think you have bursitis/tendonitis of left shoulder from repetitive movement. Treat with prescription strength naproxyn twice daily with meals for 1 week then as needed. Ice/heating pad to left shoulder alternating. Once feeling better, start stretching exercises provided today.

## 2015-11-14 NOTE — Assessment & Plan Note (Addendum)
Anticipate patient has developed L RTC tendonitis vs subdeltoid bursitis. Treat with Rx naprosyn x 1 wk and ice/heat. F/u if no better to consider steroid injection. No mechanism of injury to correspond to RTC tear. Discussed pendulum exercises to avoid frozen shoulder.

## 2015-11-14 NOTE — Progress Notes (Signed)
   BP 124/74   Pulse 88   Temp 98 F (36.7 C) (Oral)   Wt 167 lb 8 oz (76 kg)   BMI 25.66 kg/m    CC: L shoulder pain Subjective:    Patient ID: Dustin Cole, male    DOB: 25-Sep-1950, 65 y.o.   MRN: [id number]  HPI: Dustin Cole is a 65 y.o. male presenting on 11/14/2015 for Shoulder Pain (left; x 5-6 days; worse at night)   Pleasant patient comes in with 5-6 d h/o L shoulder pain worse at night time (sleeping in recliner). 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 Points to lateral shoulder area as site of pain. Initially very sore to movement and touch, slowly improving.   Treating with ibuprofen 400mg  which has helped. Also using ice and heating pad. Also using icy hot. Today feeling better.   Relevant past medical, surgical, family and social history reviewed and updated as indicated. Interim medical history since our last visit reviewed. Allergies and medications reviewed and updated. Current Outpatient Prescriptions on File Prior to Visit  Medication Sig  . aspirin 81 MG tablet Take 81 mg by mouth daily.  . Multiple Vitamin (MULTIVITAMIN) tablet Take 1 tablet by mouth daily.  . Multiple Vitamins-Minerals (PRESERVISION AREDS PO) Take by mouth daily.  . NONFORMULARY OR COMPOUNDED ITEM Apply 1 application topically 2 times daily at 12 noon and 4 pm. Cetaphil with Clobetasol 0.05%  . sildenafil (VIAGRA) 100 MG tablet Take 0.5-1 tablets (50-100 mg total) by mouth daily as needed for erectile dysfunction.   No current facility-administered medications on file prior to visit.     Review of Systems Per HPI unless specifically indicated in ROS section     Objective:    BP 124/74   Pulse 88   Temp 98 F (36.7 C) (Oral)   Wt 167 lb 8 oz (76 kg)   BMI 25.66 kg/m   Wt Readings from Last 3 Encounters:  11/14/15 167 lb 8 oz (76 kg)  08/23/15 168 lb 8 oz (76.4 kg)  05/21/15 169 lb 8 oz (76.9 kg)    Physical Exam  Constitutional: He appears  well-developed and well-nourished. No distress.  Musculoskeletal: He exhibits no edema.  R shoulder WNL L Shoulder exam: No deformity of shoulders on inspection. ++ pain with palpation of lateral upper arm at deltoid  Markedly limited ROM 2/2 pain/stiffness in abduction and forward flexion. No pain or weakness with testing SITS in ext/int rotation. Unable to test empty can sign, [place]erguson, Speed test, impingement.  Skin: Skin is warm and dry. No rash noted.  Nursing note and vitals reviewed.     Assessment & Plan:   Problem List Items Addressed This Visit    Acute pain of left shoulder    Anticipate patient has developed L RTC tendonitis vs subdeltoid bursitis. Treat with Rx naprosyn x 1 wk and ice/heat. F/u if no better to consider steroid injection. No mechanism of injury to correspond to RTC tear. Discussed pendulum exercises to avoid frozen shoulder.       Other Visit Diagnoses   None.      Follow up plan: Return if symptoms worsen or fail to improve.  Ria Bush, MD

## 2015-12-13 ENCOUNTER — Encounter: Payer: Self-pay | Admitting: Family Medicine

## 2016-04-21 ENCOUNTER — Other Ambulatory Visit: Payer: Self-pay | Admitting: Family Medicine

## 2016-04-21 DIAGNOSIS — D689 Coagulation defect, unspecified: Secondary | ICD-10-CM

## 2016-04-21 DIAGNOSIS — Z125 Encounter for screening for malignant neoplasm of prostate: Secondary | ICD-10-CM

## 2016-04-21 DIAGNOSIS — Z1159 Encounter for screening for other viral diseases: Secondary | ICD-10-CM

## 2016-04-21 DIAGNOSIS — E785 Hyperlipidemia, unspecified: Secondary | ICD-10-CM

## 2016-04-24 ENCOUNTER — Other Ambulatory Visit (INDEPENDENT_AMBULATORY_CARE_PROVIDER_SITE_OTHER): Payer: Self-pay

## 2016-04-24 DIAGNOSIS — E785 Hyperlipidemia, unspecified: Secondary | ICD-10-CM

## 2016-04-24 DIAGNOSIS — D689 Coagulation defect, unspecified: Secondary | ICD-10-CM

## 2016-04-24 DIAGNOSIS — Z1159 Encounter for screening for other viral diseases: Secondary | ICD-10-CM

## 2016-04-24 DIAGNOSIS — Z125 Encounter for screening for malignant neoplasm of prostate: Secondary | ICD-10-CM

## 2016-04-24 LAB — CBC WITH DIFFERENTIAL/PLATELET
BASOS PCT: 0.9 % (ref 0.0–3.0)
Basophils Absolute: 0.1 10*3/uL (ref 0.0–0.1)
EOS PCT: 2.6 % (ref 0.0–5.0)
Eosinophils Absolute: 0.3 10*3/uL (ref 0.0–0.7)
HCT: 47.3 % (ref 39.0–52.0)
HEMOGLOBIN: 17 g/dL (ref 13.0–17.0)
Lymphocytes Relative: 21.2 % (ref 12.0–46.0)
Lymphs Abs: 2.1 10*3/uL (ref 0.7–4.0)
MCHC: 35.9 g/dL (ref 30.0–36.0)
MCV: 84.1 fl (ref 78.0–100.0)
Monocytes Absolute: 1.1 10*3/uL — ABNORMAL HIGH (ref 0.1–1.0)
Monocytes Relative: 11.2 % (ref 3.0–12.0)
NEUTROS ABS: 6.3 10*3/uL (ref 1.4–7.7)
Neutrophils Relative %: 64.1 % (ref 43.0–77.0)
Platelets: 363 10*3/uL (ref 150.0–400.0)
RBC: 5.62 Mil/uL (ref 4.22–5.81)
RDW: 13.6 % (ref 11.5–15.5)
WBC: 9.8 10*3/uL (ref 4.0–10.5)

## 2016-04-24 LAB — LIPID PANEL
CHOLESTEROL: 197 mg/dL (ref 0–200)
HDL: 43.3 mg/dL (ref >39.00)
LDL CALC: 134 mg/dL — AB (ref 0–99)
NonHDL: 153.25
TRIGLYCERIDES: 96 mg/dL (ref 0.0–149.0)
Total CHOL/HDL Ratio: 5
VLDL: 19.2 mg/dL (ref 0.0–40.0)

## 2016-04-24 LAB — BASIC METABOLIC PANEL
BUN: 12 mg/dL (ref 6–23)
CHLORIDE: 101 meq/L (ref 96–112)
CO2: 32 meq/L (ref 19–32)
Calcium: 9.2 mg/dL (ref 8.4–10.5)
Creatinine, Ser: 0.87 mg/dL (ref 0.40–1.50)
GFR: 93.75 mL/min (ref >60.00)
GLUCOSE: 88 mg/dL (ref 70–99)
Potassium: 4.4 mEq/L (ref 3.5–5.1)
Sodium: 139 mEq/L (ref 135–145)

## 2016-04-24 LAB — PSA: PSA: 1.77 ng/mL (ref 0.10–4.00)

## 2016-04-25 LAB — HEPATITIS C ANTIBODY: HCV Ab: NEGATIVE

## 2016-05-01 ENCOUNTER — Encounter: Payer: Self-pay | Admitting: Family Medicine

## 2016-05-18 ENCOUNTER — Encounter: Payer: Self-pay | Admitting: Family Medicine

## 2016-05-18 ENCOUNTER — Ambulatory Visit (INDEPENDENT_AMBULATORY_CARE_PROVIDER_SITE_OTHER): Payer: Self-pay | Admitting: Family Medicine

## 2016-05-18 VITALS — BP 122/80 | HR 95 | Temp 97.6°F | Resp 16 | Ht 68.0 in | Wt 174.0 lb

## 2016-05-18 DIAGNOSIS — N529 Male erectile dysfunction, unspecified: Secondary | ICD-10-CM

## 2016-05-18 DIAGNOSIS — L409 Psoriasis, unspecified: Secondary | ICD-10-CM

## 2016-05-18 DIAGNOSIS — D689 Coagulation defect, unspecified: Secondary | ICD-10-CM

## 2016-05-18 DIAGNOSIS — Z0000 Encounter for general adult medical examination without abnormal findings: Secondary | ICD-10-CM

## 2016-05-18 MED ORDER — SILDENAFIL CITRATE 20 MG PO TABS: 40.0000 mg | ORAL_TABLET | Freq: Every day | ORAL | 6 refills | Status: DC | PRN

## 2016-05-18 MED ORDER — CLOBETASOL PROPIONATE 0.05 % EX CREA: 1.0000 "application " | TOPICAL_CREAM | Freq: Two times a day (BID) | CUTANEOUS | 3 refills | Status: AC

## 2016-05-18 NOTE — Progress Notes (Signed)
Pre visit review using our clinic review tool, if applicable. No additional management support is needed unless otherwise documented below in the visit note. 

## 2016-05-18 NOTE — Progress Notes (Signed)
BP 122/80 (BP Location: Left Arm, Patient Position: Sitting, Cuff Size: Large)   Pulse 95   Temp 97.6 F (36.4 C) (Oral)   Resp 16   Ht 5\' 8"  (1.727 m)   Wt 174 lb (78.9 kg)   SpO2 96%   BMI 26.46 kg/m    CC: CPE Subjective:    Patient ID: Dustin Cole, male    DOB: [birth year], 66 y.o.   MRN: [id number]  HPI: Dustin Cole is a 66 y.o. male presenting on 05/18/2016 for Annual Exam   Requests generic sildenafil 20mg  Saw derm last year - treated for psoriasis with cetaphil + clobetasol. Ran out    Preventative: Colonoscopy ~8 yrs ago with [facility] Prostate cancer screening - yearly.  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 Interim medical history since our last visit reviewed. Allergies and medications reviewed and updated. Current Outpatient Prescriptions on File Prior to Visit  Medication Sig  . aspirin 81 MG tablet Take 81 mg by mouth daily.  . Multiple Vitamin (MULTIVITAMIN) tablet Take 1 tablet by mouth daily.  . Multiple Vitamins-Minerals (PRESERVISION AREDS PO) Take by mouth daily.  . naproxen (NAPROSYN) 500 MG tablet Take one po bid x 1 week then prn pain, take with food  . NONFORMULARY OR COMPOUNDED ITEM Apply 1 application topically 2 times daily at 12 noon and 4 pm. Cetaphil with Clobetasol 0.05%   No current facility-administered medications on file prior to visit.     Review of Systems  Constitutional: Negative for activity change, appetite change, chills, fatigue, fever and unexpected weight change.  HENT: Negative for hearing loss.   Eyes: Negative  for visual disturbance.  Respiratory: Negative for cough, chest tightness, shortness of breath and wheezing.   Cardiovascular: Negative for chest pain, palpitations and leg swelling.  Gastrointestinal: Negative for abdominal distention, abdominal pain, blood in stool, constipation, diarrhea, nausea and vomiting.  Genitourinary: Negative for difficulty urinating and hematuria.  Musculoskeletal: Negative for arthralgias, myalgias and neck pain.  Skin: Negative for rash.  Neurological: Negative for dizziness, seizures, syncope and headaches.  Hematological: Negative for adenopathy. Does not bruise/bleed easily.  Psychiatric/Behavioral: Negative for dysphoric mood. The patient is not nervous/anxious.    Per HPI unless specifically indicated in ROS section     Objective:    BP 122/80 (BP Location: Left Arm, Patient Position: Sitting, Cuff Size: Large)   Pulse 95   Temp 97.6 F (36.4 C) (Oral)   Resp 16   Ht 5\' 8"  (1.727 m)   Wt 174 lb (78.9 kg)   SpO2 96%   BMI 26.46 kg/m   Wt Readings from Last 3 Encounters:  05/18/16 174 lb (78.9 kg)  11/14/15 167 lb 8 oz (76 kg)  08/23/15 168 lb 8 oz (76.4 kg)    Physical Exam  Constitutional: He is oriented to person, place, and time. He appears well-developed and well-nourished. No distress.  HENT:  Head: Normocephalic and atraumatic.  Right Ear: Hearing, tympanic membrane, external ear and ear canal normal.  Left Ear: Hearing, tympanic membrane, external ear and ear canal normal.  Nose: Nose  normal.  Mouth/Throat: Uvula is midline, oropharynx is clear and moist and mucous membranes are normal. No oropharyngeal exudate, posterior oropharyngeal edema or posterior oropharyngeal erythema.  Eyes: Conjunctivae and EOM are normal. Pupils are equal, round, and reactive to light. No scleral icterus.  Neck: Normal range of motion. Neck supple. No thyromegaly present.  Cardiovascular: Normal rate, regular rhythm, normal heart sounds and intact distal  pulses.   No murmur heard. Pulses:      Radial pulses are 2+ on the right side, and 2+ on the left side.  Pulmonary/Chest: Effort normal and breath sounds normal. No respiratory distress. He has no wheezes. He has no rales.  Abdominal: Soft. Bowel sounds are normal. He exhibits no distension and no mass. There is no tenderness. There is no rebound and no guarding.  Genitourinary: Rectum normal and prostate normal. Rectal exam shows no external hemorrhoid, no fissure, no mass and anal tone normal. Prostate is not enlarged (15gm) and not tender.  Musculoskeletal: Normal range of motion. He exhibits no edema.  Lymphadenopathy:    He has no cervical adenopathy.  Neurological: He is alert and oriented to person, place, and time.  CN grossly intact, station and gait intact  Skin: Skin is warm and dry. No rash noted.  Psychiatric: He has a normal mood and affect. His behavior is normal. Judgment and thought content normal.  Nursing note and vitals reviewed.  Results for orders placed or performed in visit on 04/24/16  Hepatitis C antibody  Result Value Ref Range   HCV Ab NEGATIVE NEGATIVE  Lipid panel  Result Value Ref Range   Cholesterol 197 0 - 200 mg/dL   Triglycerides 96.0 0.0 - 149.0 mg/dL   HDL 43.30 >39.00 mg/dL   VLDL 19.2 0.0 - 40.0 mg/dL   LDL Cholesterol 134 (H) 0 - 99 mg/dL   Total CHOL/HDL Ratio 5    NonHDL [id number]   Basic metabolic panel  Result Value Ref Range   Sodium 139 135 - 145 mEq/L   Potassium 4.4 3.5 - 5.1 mEq/L   Chloride 101 96 - 112 mEq/L   CO2 32 19 - 32 mEq/L   Glucose, Bld 88 70 - 99 mg/dL   BUN 12 6 - 23 mg/dL   Creatinine, Ser 0.87 0.40 - 1.50 mg/dL   Calcium 9.2 8.4 - 10.5 mg/dL   GFR 93.75 >60.00 mL/min  PSA  Result Value Ref Range   PSA 1.77 0.10 - 4.00 ng/mL  CBC with Differential/Platelet  Result Value Ref Range   WBC 9.8 4.0 - 10.5 K/uL   RBC 5.62 4.22 - 5.81 Mil/uL   Hemoglobin 17.0 13.0 - 17.0 g/dL   HCT 47.3 39.0 - 52.0 %   MCV 84.1  78.0 - 100.0 fl   MCHC 35.9 30.0 - 36.0 g/dL   RDW 13.6 11.5 - 15.5 %   Platelets 363.0 150.0 - 400.0 K/uL   Neutrophils Relative % 64.1 43.0 - 77.0 %   Lymphocytes Relative 21.2 12.0 - 46.0 %   Monocytes Relative 11.2 3.0 - 12.0 %   Eosinophils Relative 2.6 0.0 - 5.0 %   Basophils Relative 0.9 0.0 - 3.0 %   Neutro Abs 6.3 1.4 - 7.7 K/uL   Lymphs Abs 2.1 0.7 - 4.0 K/uL   Monocytes Absolute 1.1 (H) 0.1 - 1.0 K/uL   Eosinophils Absolute 0.3 0.0 - 0.7 K/uL   Basophils Absolute 0.1 0.0 - 0.1 K/uL      Assessment & Plan:   Problem  List Items Addressed This Visit    Blood clotting disorder ([place])    Stable period. Never received records from prior P[facility] [place]. Will request again.       Erectile dysfunction    Requests generic viagra - sildenafil refilled.       Health care maintenance - Primary    Preventative protocols reviewed and updated unless pt declined. Discussed healthy diet and lifestyle.       Psoriasis    Appreciate [facility] assistance. Refill clobetasol, if unaffordable pt will use OTC hydrocortisone.           Follow up plan: Return in about 6 months (around 11/15/2016).  Ria Bush, MD

## 2016-05-18 NOTE — Patient Instructions (Addendum)
Sign release for records from [facility] for latest colonoscopy Good to see you today, call us with questions. Return as needed or 6 months for welcome to medicare visit.   Health Maintenance, Male A healthy lifestyle and preventative care can promote health and wellness.  Maintain regular health, dental, and eye exams.  Eat a healthy diet. Foods like vegetables, fruits, whole grains, low-fat dairy products, and lean protein foods contain the nutrients you need and are low in calories. Decrease your intake of foods high in solid fats, added sugars, and salt. Get information about a proper diet from your health care provider, if necessary.  Regular physical exercise is one of the most important things you can do for your health. Most adults should get at least 150 minutes of moderate-intensity exercise (any activity that increases your heart rate and causes you to sweat) each week. In addition, most adults need muscle-strengthening exercises on 2 or more days a week.   Maintain a healthy weight. The body mass index (BMI) is a screening tool to identify possible weight problems. It provides an estimate of body fat based on height and weight. Your health care provider can find your BMI and can help you achieve or maintain a healthy weight. For males 20 years and older:  A BMI below 18.5 is considered underweight.  A BMI of 18.5 to 24.9 is normal.  A BMI of 25 to 29.9 is considered overweight.  A BMI of 30 and above is considered obese.  Maintain normal blood lipids and cholesterol by exercising and minimizing your intake of saturated fat. Eat a balanced diet with plenty of fruits and vegetables. Blood tests for lipids and cholesterol should begin at age 76 and be repeated every 5 years. If your lipid or cholesterol levels are high, you are over age 37, or you are at high risk for heart disease, you may need your cholesterol levels checked more frequently.Ongoing high lipid and cholesterol levels  should be treated with medicines if diet and exercise are not working.  If you smoke, find out from your health care provider how to quit. If you do not use tobacco, do not start.  Lung cancer screening is recommended for adults aged 40-80 years who are at high risk for developing lung cancer because of a history of smoking. A yearly low-dose CT scan of the lungs is recommended for people who have at least a 30-pack-year history of smoking and are current smokers or have quit within the past 15 years. A pack year of smoking is smoking an average of 1 pack of cigarettes a day for 1 year (for example, a 30-pack-year history of smoking could mean smoking 1 pack a day for 30 years or 2 packs a day for 15 years). Yearly screening should continue until the smoker has stopped smoking for at least 15 years. Yearly screening should be stopped for people who develop a health problem that would prevent them from having lung cancer treatment.  If you choose to drink alcohol, do not have more than 2 drinks per day. One drink is considered to be 12 oz (360 mL) of beer, 5 oz (150 mL) of wine, or 1.5 oz (45 mL) of liquor.  Avoid the use of street drugs. Do not share needles with anyone. Ask for help if you need support or instructions about stopping the use of drugs.  High blood pressure causes heart disease and increases the risk of stroke. High blood pressure is more likely to  develop in:  People who have blood pressure in the end of the normal range (100-139/85-89 mm Hg).  People who are overweight or obese.  People who are African American.  If you are 70-24 years of age, have your blood pressure checked every 3-5 years. If you are 15 years of age or older, have your blood pressure checked every year. You should have your blood pressure measured twice-once when you are at a hospital or clinic, and once when you are not at a hospital or clinic. Record the average of the two measurements. To check your blood  pressure when you are not at a hospital or clinic, you can use:  An automated blood pressure machine at a pharmacy.  A home blood pressure monitor.  If you are 47-56 years old, ask your health care provider if you should take aspirin to prevent heart disease.  Diabetes screening involves taking a blood sample to check your fasting blood sugar level. This should be done once every 3 years after age 69 if you are at a normal weight and without risk factors for diabetes. Testing should be considered at a younger age or be carried out more frequently if you are overweight and have at least 1 risk factor for diabetes.  Colorectal cancer can be detected and often prevented. Most routine colorectal cancer screening begins at the age of 68 and continues through age 39. However, your health care provider may recommend screening at an earlier age if you have risk factors for colon cancer. On a yearly basis, your health care provider may provide home test kits to check for hidden blood in the stool. A small camera at the end of a tube may be used to directly examine the colon (sigmoidoscopy or colonoscopy) to detect the earliest forms of colorectal cancer. Talk to your health care provider about this at age 66 when routine screening begins. A direct exam of the colon should be repeated every 5-10 years through age 67, unless early forms of precancerous polyps or small growths are found.  People who are at an increased risk for hepatitis B should be screened for this virus. You are considered at high risk for hepatitis B if:  You were born in a country where hepatitis B occurs often. Talk with your health care provider about which countries are considered high risk.  Your parents were born in a high-risk country and you have not received a shot to protect against hepatitis B (hepatitis B vaccine).  You have HIV or AIDS.  You use needles to inject street drugs.  You live with, or have sex with, someone who  has hepatitis B.  You are a man who has sex with other men (MSM).  You get hemodialysis treatment.  You take certain medicines for conditions like cancer, organ transplantation, and autoimmune conditions.  Hepatitis C blood testing is recommended for all people born from [birth year] through 1965 and any individual with known risk factors for hepatitis C.  Healthy men should no longer receive prostate-specific antigen (PSA) blood tests as part of routine cancer screening. Talk to your health care provider about prostate cancer screening.  Testicular cancer screening is not recommended for adolescents or adult males who have no symptoms. Screening includes self-exam, a health care provider exam, and other screening tests. Consult with your health care provider about any symptoms you have or any concerns you have about testicular cancer.  Practice safe sex. Use condoms and avoid high-risk sexual practices to reduce  the spread of sexually transmitted infections (STIs).  You should be screened for STIs, including gonorrhea and chlamydia if:  You are sexually active and are younger than 24 years.  You are older than 24 years, and your health care provider tells you that you are at risk for this type of infection.  Your sexual activity has changed since you were last screened, and you are at an increased risk for chlamydia or gonorrhea. Ask your health care provider if you are at risk.  If you are at risk of being infected with HIV, it is recommended that you take a prescription medicine daily to prevent HIV infection. This is called pre-exposure prophylaxis (PrEP). You are considered at risk if:  You are a man who has sex with other men (MSM).  You are a heterosexual man who is sexually active with multiple partners.  You take drugs by injection.  You are sexually active with a partner who has HIV.  Talk with your health care provider about whether you are at high risk of being infected with  HIV. If you choose to begin PrEP, you should first be tested for HIV. You should then be tested every 3 months for as long as you are taking PrEP.  Use sunscreen. Apply sunscreen liberally and repeatedly throughout the day. You should seek shade when your shadow is shorter than you. Protect yourself by wearing long sleeves, pants, a wide-brimmed hat, and sunglasses year round whenever you are outdoors.  Tell your health care provider of new moles or changes in moles, especially if there is a change in shape or color. Also, tell your health care provider if a mole is larger than the size of a pencil eraser.  A one-time screening for abdominal aortic aneurysm (AAA) and surgical repair of large AAAs by ultrasound is recommended for men aged 38-75 years who are current or former smokers.  Stay current with your vaccines (immunizations). This information is not intended to replace advice given to you by your health care provider. Make sure you discuss any questions you have with your health care provider. Document Released: 09/26/2007 Document Revised: 04/20/2014 Document Reviewed: 01/01/2015 Elsevier Interactive Patient Education  2017 [employer].

## 2016-05-18 NOTE — Assessment & Plan Note (Signed)
Appreciate [facility] skin care assistance. Refill clobetasol, if unaffordable pt will use OTC hydrocortisone.

## 2016-05-18 NOTE — Assessment & Plan Note (Addendum)
Stable period. Never received records from prior PCP [facility]. Will request again.

## 2016-05-18 NOTE — Assessment & Plan Note (Signed)
Requests generic viagra - sildenafil refilled.

## 2016-05-18 NOTE — Assessment & Plan Note (Signed)
Preventative protocols reviewed and updated unless pt declined. Discussed healthy diet and lifestyle.  

## 2016-08-14 ENCOUNTER — Telehealth: Payer: Self-pay

## 2016-08-14 ENCOUNTER — Telehealth: Payer: Self-pay | Admitting: Family Medicine

## 2016-08-14 NOTE — Telephone Encounter (Signed)
Pt left note requesting sildenafil 20 mg to [facility] [address]. Pt already had refills; I spoke with Jasmine at [facility] [address] and pt has the 05/18/16 # 30 x 6 refills available. Jasmine will get refill ready. Notified pt that refills already available at [address]and she will get rx ready for pick up. Pt will pick up at pharmacy.

## 2016-09-15 ENCOUNTER — Ambulatory Visit (INDEPENDENT_AMBULATORY_CARE_PROVIDER_SITE_OTHER): Payer: Self-pay

## 2016-09-15 DIAGNOSIS — Z111 Encounter for screening for respiratory tuberculosis: Secondary | ICD-10-CM

## 2016-09-18 LAB — TB SKIN TEST
Induration: 0 mm
TB Skin Test: NEGATIVE

## 2016-10-21 DIAGNOSIS — H25813 Combined forms of age-related cataract, bilateral: Secondary | ICD-10-CM | POA: Diagnosis not present

## 2016-11-20 DIAGNOSIS — L0101 Non-bullous impetigo: Secondary | ICD-10-CM | POA: Diagnosis not present

## 2016-12-04 ENCOUNTER — Encounter: Payer: Self-pay | Admitting: Family Medicine

## 2016-12-04 ENCOUNTER — Ambulatory Visit (INDEPENDENT_AMBULATORY_CARE_PROVIDER_SITE_OTHER): Payer: Medicare Other | Admitting: Family Medicine

## 2016-12-04 VITALS — BP 122/72 | HR 55 | Temp 97.5°F | Ht 67.0 in | Wt 167.5 lb

## 2016-12-04 DIAGNOSIS — Z7189 Other specified counseling: Secondary | ICD-10-CM | POA: Diagnosis not present

## 2016-12-04 DIAGNOSIS — Z0000 Encounter for general adult medical examination without abnormal findings: Secondary | ICD-10-CM | POA: Insufficient documentation

## 2016-12-04 DIAGNOSIS — R9431 Abnormal electrocardiogram [ECG] [EKG]: Secondary | ICD-10-CM | POA: Diagnosis not present

## 2016-12-04 DIAGNOSIS — D689 Coagulation defect, unspecified: Secondary | ICD-10-CM

## 2016-12-04 DIAGNOSIS — Z23 Encounter for immunization: Secondary | ICD-10-CM

## 2016-12-04 NOTE — Assessment & Plan Note (Signed)
Advanced planning - wife then niece are HCPOA. Has at home. Asked to bring copy 

## 2016-12-04 NOTE — Assessment & Plan Note (Signed)
EKG with some evidence of LVH - will check baseline echo. Pt agrees with plan.

## 2016-12-04 NOTE — Progress Notes (Addendum)
BP 122/72   Pulse (!) 55   Temp (!) 97.5 F (36.4 C) (Oral)   Ht 5\' 7"  (1.702 m)   Wt 167 lb 8 oz (76 kg)   SpO2 97%   BMI 26.23 kg/m    CC: welcome to medicare visit Subjective:    Patient ID: Dustin Cole, male    DOB: 1950-10-23, 66 y.o.   MRN: [id number]  HPI: Dustin Cole is a 66 y.o. male presenting on 12/04/2016 for Annual Exam (Welcome to [facility])   No falls in past year Denies depression. Vision Screening Comments: Done 10/2015    Preventative: Colonoscopy ~8 yrs ago with [facility]. Records never received. requested again today.  Prostate cancer screening - yearly.  Flu shot yearly Pneumovax 12/2013. prevnar today.  Td thinks 2015 Zostavax with [facility] thinks 2015 Shingrix - discussed Advanced planning - wife then niece are HCPOA. Has at home. Asked to bring copy Seat belt use discussed Sunscreen use discussed, denies changing moles.  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 Interim medical history since our last visit reviewed. Allergies and medications reviewed and updated. Outpatient Medications Prior to Visit  Medication Sig Dispense Refill  . aspirin 81 MG tablet Take 81 mg by mouth daily.    . clobetasol cream (TEMOVATE) 3.01 % Apply 1 application topically 2 (two) times daily. Apply to AA 60 g 3  . Multiple Vitamin (MULTIVITAMIN) tablet Take 1 tablet by mouth daily.    . Multiple Vitamins-Minerals (PRESERVISION AREDS PO) Take by mouth daily.    . naproxen (NAPROSYN) 500 MG tablet Take one po bid x 1 week then prn pain, take with food 40 tablet 0  . NONFORMULARY OR COMPOUNDED ITEM Apply 1 application topically 2 times daily at 12 noon and 4 pm. Cetaphil with Clobetasol 0.05%    . sildenafil (REVATIO)  20 MG tablet Take 2-5 tablets (40-100 mg total) by mouth daily as needed (relations). 30 tablet 6   No facility-administered medications prior to visit.      Per HPI unless specifically indicated in ROS section below Review of Systems     Objective:    BP 122/72   Pulse (!) 55   Temp (!) 97.5 F (36.4 C) (Oral)   Ht 5\' 7"  (1.702 m)   Wt 167 lb 8 oz (76 kg)   SpO2 97%   BMI 26.23 kg/m   Wt Readings from Last 3 Encounters:  12/04/16 167 lb 8 oz (76 kg)  05/18/16 174 lb (78.9 kg)  11/14/15 167 lb 8 oz (76 kg)    Physical Exam  Constitutional: He is oriented to person, place, and time. He appears well-developed and well-nourished. No distress.  HENT:  Head: Normocephalic and atraumatic.  Right Ear: Hearing, tympanic membrane, external ear and ear canal normal.  Left Ear: Hearing, tympanic membrane, external ear and ear canal normal.  Nose: Nose normal.  Mouth/Throat: Uvula is midline, oropharynx is clear and moist and mucous membranes are normal. No oropharyngeal exudate, posterior oropharyngeal edema or posterior oropharyngeal erythema.  Eyes: Pupils are equal, round, and reactive to light. Conjunctivae and EOM are normal. No scleral icterus.  Neck: Normal range of motion. Neck supple. Carotid bruit is not present. No thyromegaly present.  Cardiovascular: Normal rate, regular rhythm, normal heart sounds and intact distal pulses.   No murmur heard. Pulses:      Radial pulses are 2+ on the right side, and 2+ on the left side.  Pulmonary/Chest: Effort normal and breath sounds normal. No respiratory distress. He has no wheezes. He has no rales.  Abdominal: Soft. Bowel sounds are normal. He exhibits no distension and no mass. There is no tenderness. There is no rebound and no guarding.  Musculoskeletal: Normal range of motion. He exhibits no edema.  Lymphadenopathy:    He has no cervical adenopathy.  Neurological: He is alert and oriented to person, place, and time.  CN grossly  intact, station and gait intact Recall 2/3, 3/3 with cue Calculation 3/5 serial 7s  Skin: Skin is warm and dry. No rash noted.  Psychiatric: He has a normal mood and affect. His behavior is normal. Judgment and thought content normal.  Nursing note and vitals reviewed.  Results for orders placed or performed in visit on 09/15/16  TB Skin Test  Result Value Ref Range   TB Skin Test Negative    Induration 0 mm mm   EKG - NSR rate 70, normal axis and intervals, no acute ST/T changes, LVH with possible p mitrale    Assessment & Plan:   Problem List Items Addressed This Visit    Abnormal EKG    EKG with some evidence of LVH - will check baseline echo. Pt agrees with plan.       Relevant Orders   ECHOCARDIOGRAM COMPLETE   Advanced care planning/counseling discussion    Advanced planning - wife then niece are HCPOA. Has at home. Asked to bring copy      Blood clotting disorder [facility])    Records never received despite multiple requests.       Welcome to Medicare preventive visit - Primary    I have personally reviewed the Medicare Annual Wellness questionnaire and have noted 1. The patient's medical and social history 2. Their use of alcohol, tobacco or illicit drugs 3. Their current medications and supplements 4. The patient's functional ability including ADL's, fall risks, home safety risks and hearing or visual impairment. Cognitive function has been assessed and addressed as indicated.  5. Diet and physical activity 6. Evidence for depression or mood disorders The patients weight, height, BMI have been recorded in the chart. I have made referrals, counseling and provided education to the patient based on review of the above and I have provided the pt with a written personalized care plan for preventive services. Provider list updated.. See scanned questionairre as needed for further documentation. Reviewed preventative protocols and updated unless pt declined.       Relevant  Orders   EKG 12-Lead (Completed)       Follow up plan: Return in about 1 year (around 12/04/2017) for medicare wellness visit.  Ria Bush, MD

## 2016-12-04 NOTE — Patient Instructions (Addendum)
Prevnar today. Hearing screen today Bring me copy of your advanced directive to update your chart.  If interested, check with pharmacy about new 2 shot shingles series (shingrix).  Sign release up front for records of colonoscopy from [facility] and prior records from [facility] Return in 1 year for next medicare wellness visit with Katha Cabal and follow up with me.   Health Maintenance, Male A healthy lifestyle and preventive care is important for your health and wellness. Ask your health care provider about what schedule of regular examinations is right for you. What should I know about weight and diet? Eat a Healthy Diet  Eat plenty of vegetables, fruits, whole grains, low-fat dairy products, and lean protein.  Do not eat a lot of foods high in solid fats, added sugars, or salt.  Maintain a Healthy Weight Regular exercise can help you achieve or maintain a healthy weight. You should:  Do at least 150 minutes of exercise each week. The exercise should increase your heart rate and make you sweat (moderate-intensity exercise).  Do strength-training exercises at least twice a week.  Watch Your Levels of Cholesterol and Blood Lipids  Have your blood tested for lipids and cholesterol every 5 years starting at 65 years of age. If you are at high risk for heart disease, you should start having your blood tested when you are 65 years old. You may need to have your cholesterol levels checked more often if: ? Your lipid or cholesterol levels are high. ? You are older than 65 years of age. ? You are at high risk for heart disease.  What should I know about cancer screening? Many types of cancers can be detected early and may often be prevented. Lung Cancer  You should be screened every year for lung cancer if: ? You are a current smoker who has smoked for at least 30 years. ? You are a former smoker who has quit within the past 15 years.  Talk to your health care provider about your screening options,  when you should start screening, and how often you should be screened.  Colorectal Cancer  Routine colorectal cancer screening usually begins at 65 years of age and should be repeated every 5-10 years until you are 65 years old. You may need to be screened more often if early forms of precancerous polyps or small growths are found. Your health care provider may recommend screening at an earlier age if you have risk factors for colon cancer.  Your health care provider may recommend using home test kits to check for hidden blood in the stool.  A small camera at the end of a tube can be used to examine your colon (sigmoidoscopy or colonoscopy). This checks for the earliest forms of colorectal cancer.  Prostate and Testicular Cancer  Depending on your age and overall health, your health care provider may do certain tests to screen for prostate and testicular cancer.  Talk to your health care provider about any symptoms or concerns you have about testicular or prostate cancer.  Skin Cancer  Check your skin from head to toe regularly.  Tell your health care provider about any new moles or changes in moles, especially if: ? There is a change in a mole's size, shape, or color. ? You have a mole that is larger than a pencil eraser.  Always use sunscreen. Apply sunscreen liberally and repeat throughout the day.  Protect yourself by wearing long sleeves, pants, a wide-brimmed hat, and sunglasses when outside.  What should I know about heart disease, diabetes, and high blood pressure?  If you are 28-51 years of age, have your blood pressure checked every 3-5 years. If you are 69 years of age or older, have your blood pressure checked every year. You should have your blood pressure measured twice-once when you are at a hospital or clinic, and once when you are not at a hospital or clinic. Record the average of the two measurements. To check your blood pressure when you are not at a hospital or  clinic, you can use: ? An automated blood pressure machine at a pharmacy. ? A home blood pressure monitor.  Talk to your health care provider about your target blood pressure.  If you are between 36-38 years old, ask your health care provider if you should take aspirin to prevent heart disease.  Have regular diabetes screenings by checking your fasting blood sugar level. ? If you are at a normal weight and have a low risk for diabetes, have this test once every three years after the age of 34. ? If you are overweight and have a high risk for diabetes, consider being tested at a younger age or more often.  A one-time screening for abdominal aortic aneurysm (AAA) by ultrasound is recommended for men aged 90-75 years who are current or former smokers. What should I know about preventing infection? Hepatitis B If you have a higher risk for hepatitis B, you should be screened for this virus. Talk with your health care provider to find out if you are at risk for hepatitis B infection. Hepatitis C Blood testing is recommended for:  Everyone born from [birth year] through 1964.  Anyone with known risk factors for hepatitis C.  Sexually Transmitted Diseases (STDs)  You should be screened each year for STDs including gonorrhea and chlamydia if: ? You are sexually active and are younger than 64 years of age. ? You are older than 65 years of age and your health care provider tells you that you are at risk for this type of infection. ? Your sexual activity has changed since you were last screened and you are at an increased risk for chlamydia or gonorrhea. Ask your health care provider if you are at risk.  Talk with your health care provider about whether you are at high risk of being infected with HIV. Your health care provider may recommend a prescription medicine to help prevent HIV infection.  What else can I do?  Schedule regular health, dental, and eye exams.  Stay current with your vaccines  (immunizations).  Do not use any tobacco products, such as cigarettes, chewing tobacco, and e-cigarettes. If you need help quitting, ask your health care provider.  Limit alcohol intake to no more than 2 drinks per day. One drink equals 12 ounces of beer, 5 ounces of wine, or 1 ounces of hard liquor.  Do not use street drugs.  Do not share needles.  Ask your health care provider for help if you need support or information about quitting drugs.  Tell your health care provider if you often feel depressed.  Tell your health care provider if you have ever been abused or do not feel safe at home. This information is not intended to replace advice given to you by your health care provider. Make sure you discuss any questions you have with your health care provider. Document Released: 09/26/2007 Document Revised: 11/27/2015 Document Reviewed: 01/01/2015 Elsevier Interactive Patient Education  [employer].

## 2016-12-04 NOTE — Assessment & Plan Note (Signed)
Records never received despite multiple requests.

## 2016-12-04 NOTE — Addendum Note (Signed)
Addended by: Modena Nunnery on: 12/04/2016 11:00 AM   Modules accepted: Orders

## 2016-12-04 NOTE — Assessment & Plan Note (Signed)

## 2016-12-12 ENCOUNTER — Encounter: Payer: Self-pay | Admitting: Family Medicine

## 2016-12-12 ENCOUNTER — Telehealth: Payer: Self-pay | Admitting: Family Medicine

## 2016-12-12 DIAGNOSIS — Z9081 Acquired absence of spleen: Secondary | ICD-10-CM | POA: Insufficient documentation

## 2016-12-12 NOTE — Telephone Encounter (Signed)
plz notify - I received records from Dr Lysle Rubens - he had colonoscopy 2007 so is due for rpt colon cancer screening. Would he like to rpt colonoscopy or does he want to do stool kit this year?

## 2016-12-15 NOTE — Telephone Encounter (Signed)
Spoke to pt and advised. States he prefers to do the stool kit, as long as Dr Darnell Level believes it will be as accurate as a colonoscopy.

## 2016-12-15 NOTE — Telephone Encounter (Signed)
Colonoscopy is the most reassuring because it directly visualizes colon. I think it'd be reasonable to start with a stool kit if he'd prefer however. We can do Q1 yr stool kit that checks for blood or Q3 year cologuard that checks for blood and 10 DNA markers that would increase risk.

## 2016-12-16 NOTE — Telephone Encounter (Signed)
Spoke to pt and advised. States he is ok with completing stool card

## 2016-12-18 DIAGNOSIS — L309 Dermatitis, unspecified: Secondary | ICD-10-CM | POA: Diagnosis not present

## 2016-12-25 ENCOUNTER — Other Ambulatory Visit: Payer: Medicare Other

## 2016-12-28 ENCOUNTER — Ambulatory Visit (INDEPENDENT_AMBULATORY_CARE_PROVIDER_SITE_OTHER): Payer: Medicare Other

## 2016-12-28 ENCOUNTER — Other Ambulatory Visit: Payer: Self-pay

## 2016-12-28 DIAGNOSIS — R9431 Abnormal electrocardiogram [ECG] [EKG]: Secondary | ICD-10-CM | POA: Diagnosis not present

## 2017-01-19 DIAGNOSIS — Z23 Encounter for immunization: Secondary | ICD-10-CM | POA: Diagnosis not present

## 2017-02-08 DIAGNOSIS — L0101 Non-bullous impetigo: Secondary | ICD-10-CM | POA: Diagnosis not present

## 2017-03-15 DIAGNOSIS — L0101 Non-bullous impetigo: Secondary | ICD-10-CM | POA: Diagnosis not present

## 2017-06-01 ENCOUNTER — Other Ambulatory Visit: Payer: Self-pay | Admitting: Family Medicine

## 2017-08-12 DIAGNOSIS — H25813 Combined forms of age-related cataract, bilateral: Secondary | ICD-10-CM | POA: Diagnosis not present

## 2017-09-29 ENCOUNTER — Ambulatory Visit (INDEPENDENT_AMBULATORY_CARE_PROVIDER_SITE_OTHER): Payer: Medicare Other

## 2017-09-29 DIAGNOSIS — Z111 Encounter for screening for respiratory tuberculosis: Secondary | ICD-10-CM | POA: Diagnosis not present

## 2017-09-30 ENCOUNTER — Ambulatory Visit: Payer: Medicare Other

## 2017-10-01 LAB — TB SKIN TEST
Induration: 0 mm
TB SKIN TEST: NEGATIVE

## 2017-12-09 ENCOUNTER — Other Ambulatory Visit: Payer: Self-pay | Admitting: Family Medicine

## 2017-12-09 DIAGNOSIS — E785 Hyperlipidemia, unspecified: Secondary | ICD-10-CM

## 2017-12-09 DIAGNOSIS — Z125 Encounter for screening for malignant neoplasm of prostate: Secondary | ICD-10-CM

## 2017-12-09 DIAGNOSIS — D580 Hereditary spherocytosis: Secondary | ICD-10-CM

## 2017-12-10 ENCOUNTER — Ambulatory Visit (INDEPENDENT_AMBULATORY_CARE_PROVIDER_SITE_OTHER): Payer: Medicare Other

## 2017-12-10 ENCOUNTER — Other Ambulatory Visit: Payer: Medicare Other

## 2017-12-10 VITALS — BP 122/84 | HR 62 | Temp 97.8°F | Ht 68.0 in | Wt 168.8 lb

## 2017-12-10 DIAGNOSIS — D580 Hereditary spherocytosis: Secondary | ICD-10-CM

## 2017-12-10 DIAGNOSIS — Z0000 Encounter for general adult medical examination without abnormal findings: Secondary | ICD-10-CM | POA: Diagnosis not present

## 2017-12-10 DIAGNOSIS — E785 Hyperlipidemia, unspecified: Secondary | ICD-10-CM | POA: Diagnosis not present

## 2017-12-10 DIAGNOSIS — Z125 Encounter for screening for malignant neoplasm of prostate: Secondary | ICD-10-CM | POA: Diagnosis not present

## 2017-12-10 LAB — CBC WITH DIFFERENTIAL/PLATELET
BASOS PCT: 0.8 %
Basophils Absolute: 71 cells/uL (ref 0–200)
Eosinophils Absolute: 178 cells/uL (ref 15–500)
Eosinophils Relative: 2 %
HEMATOCRIT: 47.8 % (ref 38.5–50.0)
Hemoglobin: 17.4 g/dL — ABNORMAL HIGH (ref 13.2–17.1)
LYMPHS ABS: 1638 {cells}/uL (ref 850–3900)
MCH: 30.5 pg (ref 27.0–33.0)
MCHC: 36.4 g/dL — ABNORMAL HIGH (ref 32.0–36.0)
MCV: 83.9 fL (ref 80.0–100.0)
MPV: 10.9 fL (ref 7.5–12.5)
Monocytes Relative: 11.9 %
Neutro Abs: 5954 cells/uL (ref 1500–7800)
Neutrophils Relative %: 66.9 %
Platelets: 366 10*3/uL (ref 140–400)
RBC: 5.7 10*6/uL (ref 4.20–5.80)
RDW: 13.1 % (ref 11.0–15.0)
Total Lymphocyte: 18.4 %
WBC: 8.9 10*3/uL (ref 3.8–10.8)
WBCMIX: 1059 {cells}/uL — AB (ref 200–950)

## 2017-12-10 LAB — BASIC METABOLIC PANEL
BUN: 11 mg/dL (ref 6–23)
CHLORIDE: 100 meq/L (ref 96–112)
CO2: 32 mEq/L (ref 19–32)
CREATININE: 0.91 mg/dL (ref 0.40–1.50)
Calcium: 9.2 mg/dL (ref 8.4–10.5)
GFR: 88.56 mL/min (ref >60.00)
GLUCOSE: 92 mg/dL (ref 70–99)
Potassium: 4.1 mEq/L (ref 3.5–5.1)
Sodium: 139 mEq/L (ref 135–145)

## 2017-12-10 LAB — LIPID PANEL
CHOL/HDL RATIO: 5
Cholesterol: 202 mg/dL — ABNORMAL HIGH (ref 0–200)
HDL: 42.2 mg/dL (ref >39.00)
LDL CALC: 134 mg/dL — AB (ref 0–99)
NONHDL: 160.03
Triglycerides: 131 mg/dL (ref 0.0–149.0)
VLDL: 26.2 mg/dL (ref 0.0–40.0)

## 2017-12-10 LAB — PSA, MEDICARE: PSA: 1.86 ng/ml (ref 0.10–4.00)

## 2017-12-10 NOTE — Progress Notes (Signed)
PCP notes:   Health maintenance:  Colonoscopy - PCP follow-up needed Flu vaccine - addressed  Abnormal screenings:   Hearing - failed  Hearing Screening   125Hz  250Hz  500Hz  1000Hz  2000Hz  3000Hz  4000Hz  6000Hz  8000Hz   Right ear:   40 40 40  0    Left ear:   40 40 40  0     Patient concerns:   None  Nurse concerns:  None  Next PCP appt:   12/17/17 @ 830

## 2017-12-10 NOTE — Progress Notes (Signed)
Subjective:   Dustin Cole is a 67 y.o. male who presents for Medicare Annual/Subsequent preventive examination.  Review of Systems:  N/A Cardiac Risk Factors include: advanced age (>60men, >53 women);male gender;dyslipidemia     Objective:    Vitals: BP 122/84 (BP Location: Right Arm, Patient Position: Sitting, Cuff Size: Normal)   Pulse 62   Temp 97.8 F (36.6 C) (Oral)   Ht 5\' 8"  (1.727 m) Comment: shoes  Wt 168 lb 12 oz (76.5 kg)   SpO2 97%   BMI 25.66 kg/m   Body mass index is 25.66 kg/m.  Advanced Directives 12/10/2017  Does Patient Have a Medical Advance Directive? Yes  Type of Paramedic of [place];Living will  Copy of [place] in Chart? No - copy requested    Tobacco Social History   Tobacco Use  Smoking Status Never Smoker  Smokeless Tobacco Never Used     Counseling given: No   Clinical Intake:  Pre-visit preparation completed: Yes  Pain : No/denies pain Pain Score: 0-No pain     Nutritional Status: BMI 25 -29 Overweight Nutritional Risks: None Diabetes: No  How often do you need to have someone help you when you read instructions, pamphlets, or other written materials from your doctor or pharmacy?: 1 - Never What is the last grade level you completed in school?: 12th grade  Interpreter Needed?: No  Comments: pt lives with spouse Information entered by :: LPinson, LPN  Past Medical History:  Diagnosis Date  . Hereditary spherocytosis ([place])    s/p splenectomy as teen  . History of chicken pox   . 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 COLONOSCOPY  2007   WNL per prior PCP records  . SPLENECTOMY  1970s   congenital spherocytosis   Family History  Problem Relation Age of Onset  . Cancer Mother        breast  . Clotting disorder Brother        ?hereditary spherocytosis  . Cancer Father 58       prostate  . CAD  Father 51       CABG  . Stroke Neg Hx   . Diabetes Neg Hx   . Hypertension Neg Hx    Social History   Socioeconomic History  . Marital status: Married    Spouse name: Not on file  . Number of children: Not on file  . Years of education: Not on file  . Highest education level: Not on file  Occupational History  . Not on file  Social Needs  . Financial resource strain: Not on file  . Food insecurity:    Worry: Not on file    Inability: Not on file  . Transportation needs:    Medical: Not on file    Non-medical: Not on file  Tobacco Use  . Smoking status: Never Smoker  . Smokeless tobacco: Never Used  Substance and Sexual Activity  . Alcohol use: No    Alcohol/week: 0.0 standard drinks  . Drug use: No  . Sexual activity: Not on file  Lifestyle  . Physical activity:    Days per week: Not on file    Minutes per session: Not on file  . Stress: Not on file  Relationships  . Social connections:    Talks on phone: Not on file    Gets together: Not on file  Attends religious service: Not on file    Active member of club or organization: Not on file    Attends meetings of clubs or organizations: Not on file    Relationship status: Not on file  Other Topics Concern  . 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 aspirin 81 MG tablet Take 81 mg by mouth daily.  . Multiple Vitamin (MULTIVITAMIN) tablet Take 1 tablet by mouth daily.  . Multiple Vitamins-Minerals (PRESERVISION AREDS PO) Take by mouth daily.  . naproxen (NAPROSYN) 500 MG tablet Take one po bid x 1 week then prn pain, take with food  . sildenafil (REVATIO) 20 MG tablet TAKE TWO TO FIVE TABLETS BY MOUTH DAILY AS NEEDED FOR RELATIONS  . [DISCONTINUED] cephALEXin (KEFLEX) 500 MG  capsule Take 500 mg by mouth 2 (two) times daily.  . [DISCONTINUED] NONFORMULARY OR COMPOUNDED ITEM Apply 1 application topically 2 times daily at 12 noon and 4 pm. Cetaphil with Clobetasol 0.05%   No facility-administered encounter medications on file as of 12/10/2017.     Activities of Daily Living In your present state of health, do you have any difficulty performing the following activities: 12/10/2017  Hearing? N  Vision? N  Difficulty concentrating or making decisions? N  Walking or climbing stairs? N  Dressing or bathing? N  Doing errands, shopping? N  Preparing Food and eating ? N  Using the Toilet? N  In the past six months, have you accidently leaked urine? N  Do you have problems with loss of bowel control? N  Managing your Medications? N  Managing your Finances? N  Housekeeping or managing your Housekeeping? N  Some recent data might be hidden    Patient Care Team: Ria Bush, MD as PCP - General (Family Medicine)   Assessment:   This is a routine wellness examination for Dustin Cole.   Hearing Screening   125Hz  250Hz  500Hz  1000Hz  2000Hz  3000Hz  4000Hz  6000Hz  8000Hz   Right ear:   40 40 40  0    Left ear:   40 40 40  0    Vision Screening Comments: Vision exam in Spring 2019 with Dr. Gloriann Loan   Exercise Activities and Dietary recommendations Current Exercise Habits: The patient has a physically strenous job, but has no regular exercise apart from work., Exercise limited by: None identified  Goals    . DIET - INCREASE WATER INTAKE     Starting 12/10/2017, I will continue to drink at least 6-8 glasses of water daily.       Fall Risk Fall Risk  12/10/2017 12/04/2016 05/18/2016  Falls in the past year? No No No   Depression Screen PHQ 2/9 Scores 12/10/2017 12/04/2016 05/18/2016  PHQ - 2 Score 0 0 0  PHQ- 9 Score 0 - -    Cognitive Function MMSE - Mini Mental State Exam 12/10/2017  Orientation to time 5  Orientation to Place 5  Registration 3  Attention/  Calculation 0  Recall 3  Language- name 2 objects 0  Language- repeat 1  Language- follow 3 step command 3  Language- read & follow direction 0  Write a sentence 0  Copy design 0  Total score 20     PLEASE NOTE: A Mini-Cog screen  was completed. Maximum score is 20. A value of 0 denotes this part of [place] MMSE was not completed or the patient failed this part of the Mini-Cog screening.   Mini-Cog Screening Orientation to Time - Max 5 pts Orientation to Place - Max 5 pts Registration - Max 3 pts Recall - Max 3 pts Language Repeat - Max 1 pts Language Follow 3 Step Command - Max 3 pts    Immunization History  Administered Date(s) Administered  . Influenza,inj,Quad PF,6+ Mos 01/19/2017  . Influenza-Unspecified 01/15/2014, 01/09/2015, 01/12/2016  . PPD Test 07/27/2014, 07/23/2015, 09/15/2016, 09/29/2017  . Pneumococcal Conjugate-13 12/04/2016  . Pneumococcal Polysaccharide-23 12/22/2013  . Tdap 12/22/2013  . Zoster 12/16/2012    Screening Tests Health Maintenance  Topic Date Due  . INFLUENZA VACCINE  07/13/2018 (Originally 11/11/2017)  . COLONOSCOPY  07/13/2018 (Originally 10/20/2001)  . PNA vac Low Risk Adult (2 of 2 - PPSV23) 12/23/2018  . DTaP/Tdap/Td (2 - Td) 12/23/2023  . TETANUS/TDAP  12/23/2023  . Hepatitis C Screening  Completed       Plan:     I have personally reviewed, addressed, and noted the following in the patient's chart:  A. Medical and social history B. Use of alcohol, tobacco or illicit drugs  C. Current medications and supplements D. Functional ability and status E.  Nutritional status F.  Physical activity G. Advance directives H. List of other physicians I.  Hospitalizations, surgeries, and ER visits in previous 12 months J.  [place] to include hearing, vision, cognitive, depression L. Referrals and appointments - none  In addition, I have reviewed and discussed with patient certain preventive protocols, quality metrics, and  best practice recommendations. A written personalized care plan for preventive services as well as general preventive health recommendations were provided to patient.  See attached scanned questionnaire for additional information.   Signed,   Lindell Noe, MHA, BS, LPN Health Coach

## 2017-12-10 NOTE — Patient Instructions (Signed)
Mr. Haigler , Thank you for taking time to come for your Medicare Wellness Visit. I appreciate your ongoing commitment to your health goals. Please review the following plan we discussed and let me know if I can assist you in the future.   These are the goals we discussed: Goals    . DIET - INCREASE WATER INTAKE     Starting 12/10/2017, I will continue to drink at least 6-8 glasses of water daily.       This is a list of the screening recommended for you and due dates:  Health Maintenance  Topic Date Due  . Flu Shot  07/13/2018*  . Colon Cancer Screening  07/13/2018*  . Pneumonia vaccines (2 of 2 - PPSV23) 12/23/2018  . DTaP/Tdap/Td vaccine (2 - Td) 12/23/2023  . Tetanus Vaccine  12/23/2023  .  Hepatitis C: One time screening is recommended by Center for Disease Control  (CDC) for  adults born from [birth year] through 1964.   Completed  *Topic was postponed. The date shown is not the original due date.   Preventive Care for Adults  A healthy lifestyle and preventive care can promote health and wellness. Preventive health guidelines for adults include the following key practices.  . A routine yearly physical is a good way to check with your health care provider about your health and preventive screening. It is a chance to share any concerns and updates on your health and to receive a thorough exam.  . Visit your dentist for a routine exam and preventive care every 6 months. Brush your teeth twice a day and floss once a day. Good oral hygiene prevents tooth decay and gum disease.  . The frequency of eye exams is based on your age, health, family medical history, use  of contact lenses, and other factors. Follow your health care provider's recommendations for frequency of eye exams.  . Eat a healthy diet. Foods like vegetables, fruits, whole grains, low-fat dairy products, and lean protein foods contain the nutrients you need without too many calories. Decrease your intake of foods high in  solid fats, added sugars, and salt. Eat the right amount of calories for you. Get information about a proper diet from your health care provider, if necessary.  . Regular physical exercise is one of the most important things you can do for your health. Most adults should get at least 150 minutes of moderate-intensity exercise (any activity that increases your heart rate and causes you to sweat) each week. In addition, most adults need muscle-strengthening exercises on 2 or more days a week.  Silver Sneakers may be a benefit available to you. To determine eligibility, you may visit the website: www.silversneakers.com or contact program at 7240649818 Mon-Fri between 8AM-8PM.   . Maintain a healthy weight. The body mass index (BMI) is a screening tool to identify possible weight problems. It provides an estimate of body fat based on height and weight. Your health care provider can find your BMI and can help you achieve or maintain a healthy weight.   For adults 20 years and older: ? A BMI below 18.5 is considered underweight. ? A BMI of 18.5 to 24.9 is normal. ? A BMI of 25 to 29.9 is considered overweight. ? A BMI of 30 and above is considered obese.   . Maintain normal blood lipids and cholesterol levels by exercising and minimizing your intake of saturated fat. Eat a balanced diet with plenty of fruit and vegetables. Blood tests for lipids and  cholesterol should begin at age 60 and be repeated every 5 years. If your lipid or cholesterol levels are high, you are over 50, or you are at high risk for heart disease, you may need your cholesterol levels checked more frequently. Ongoing high lipid and cholesterol levels should be treated with medicines if diet and exercise are not working.  . If you smoke, find out from your health care provider how to quit. If you do not use tobacco, please do not start.  . If you choose to drink alcohol, please do not consume more than 2 drinks per day. One drink  is considered to be 12 ounces (355 mL) of beer, 5 ounces (148 mL) of wine, or 1.5 ounces (44 mL) of liquor.  . If you are 40-45 years old, ask your health care provider if you should take aspirin to prevent strokes.  . Use sunscreen. Apply sunscreen liberally and repeatedly throughout the day. You should seek shade when your shadow is shorter than you. Protect yourself by wearing long sleeves, pants, a wide-brimmed hat, and sunglasses year round, whenever you are outdoors.  . Once a month, do a whole body skin exam, using a mirror to look at the skin on your back. Tell your health care provider of new moles, moles that have irregular borders, moles that are larger than a pencil eraser, or moles that have changed in shape or color.

## 2017-12-17 ENCOUNTER — Encounter: Payer: Self-pay | Admitting: Family Medicine

## 2017-12-17 ENCOUNTER — Ambulatory Visit (INDEPENDENT_AMBULATORY_CARE_PROVIDER_SITE_OTHER): Payer: Medicare Other | Admitting: Family Medicine

## 2017-12-17 ENCOUNTER — Ambulatory Visit (INDEPENDENT_AMBULATORY_CARE_PROVIDER_SITE_OTHER)
Admission: RE | Admit: 2017-12-17 | Discharge: 2017-12-17 | Disposition: A | Discharge status: Home / Self Care | Payer: Medicare Other | Source: Ambulatory Visit | Attending: Family Medicine | Admitting: Family Medicine

## 2017-12-17 VITALS — BP 118/78 | HR 104 | Temp 97.8°F | Ht 68.0 in | Wt 169.8 lb

## 2017-12-17 DIAGNOSIS — D751 Secondary polycythemia: Secondary | ICD-10-CM | POA: Diagnosis not present

## 2017-12-17 DIAGNOSIS — Z23 Encounter for immunization: Secondary | ICD-10-CM

## 2017-12-17 DIAGNOSIS — Z9081 Acquired absence of spleen: Secondary | ICD-10-CM

## 2017-12-17 DIAGNOSIS — D580 Hereditary spherocytosis: Secondary | ICD-10-CM

## 2017-12-17 DIAGNOSIS — Z7189 Other specified counseling: Secondary | ICD-10-CM

## 2017-12-17 LAB — POC URINALSYSI DIPSTICK (AUTOMATED)
Bilirubin, UA: NEGATIVE
Blood, UA: NEGATIVE
Glucose, UA: NEGATIVE
KETONES UA: NEGATIVE
LEUKOCYTES UA: NEGATIVE
NITRITE UA: NEGATIVE
PROTEIN UA: NEGATIVE
Spec Grav, UA: 1.015 (ref 1.010–1.025)
Urobilinogen, UA: 0.2 E.U./dL
pH, UA: 6 (ref 5.0–8.0)

## 2017-12-17 NOTE — Assessment & Plan Note (Signed)
Advanced planning - wife then niece are HCPOA. Has at home. Asked to bring copy

## 2017-12-17 NOTE — Progress Notes (Signed)
BP 118/78 (BP Location: Left Arm, Patient Position: Sitting, Cuff Size: Normal)   Pulse (!) 104   Temp 97.8 F (36.6 C) (Oral)   Ht 5\' 8"  (1.727 m)   Wt 169 lb 12 oz (77 kg)   SpO2 95%   BMI 25.81 kg/m    CC: CPE Subjective:    Patient ID: Dustin Cole, male    DOB: Mar 07, 1951, 67 y.o.   MRN: [id number]  HPI: Dustin Cole is a 67 y.o. male presenting on 12/17/2017 for Annual Exam (Pt 2.)   Saw Katha Cabal last week for medicare wellness visit. Note reviewed.  New polycythemia.  Preventative: Colonoscopy ~39yrs ago with [facility]. Discussed options. Desires to try cologuard.  Prostate cancer screening - yearly.  Flu shot yearly Pneumovax 12/2013. prevnar 2018. Needs pneumovax Q5 yrs Tdap 12/2013 Zostavax with [facility] thinks 2015 Shingrix - discussed Advanced planning - wife then niece are HCPOA. Has at home. Asked to bring copy Seat belt use discussed Sunscreen use discussed, denies changing moles.  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 Interim medical history since our last visit reviewed. Allergies and medications reviewed and updated. Outpatient Medications Prior to Visit  Medication Sig Dispense Refill  . aspirin 81 MG tablet Take 81 mg by mouth daily.    . Multiple Vitamin (MULTIVITAMIN) tablet Take 1 tablet by mouth daily.    . Multiple Vitamins-Minerals (PRESERVISION AREDS PO) Take by mouth daily.    . naproxen (NAPROSYN) 500 MG tablet Take one po bid x 1 week then prn pain, take with food 40 tablet 0  . sildenafil (REVATIO) 20 MG tablet TAKE TWO TO FIVE TABLETS BY MOUTH DAILY AS NEEDED FOR RELATIONS 30 tablet 5   No facility-administered medications prior to visit.      Per HPI  unless specifically indicated in ROS section below Review of Systems     Objective:    BP 118/78 (BP Location: Left Arm, Patient Position: Sitting, Cuff Size: Normal)   Pulse (!) 104   Temp 97.8 F (36.6 C) (Oral)   Ht 5\' 8"  (1.727 m)   Wt 169 lb 12 oz (77 kg)   SpO2 95%   BMI 25.81 kg/m   Wt Readings from Last 3 Encounters:  12/17/17 169 lb 12 oz (77 kg)  12/10/17 168 lb 12 oz (76.5 kg)  12/04/16 167 lb 8 oz (76 kg)    Physical Exam  Constitutional: He is oriented to person, place, and time. He appears well-developed and well-nourished. No distress.  HENT:  Head: Normocephalic and atraumatic.  Right Ear: Hearing, tympanic membrane, external ear and ear canal normal.  Left Ear: Hearing, tympanic membrane, external ear and ear canal normal.  Nose: Nose normal.  Mouth/Throat: Uvula is midline, oropharynx is clear and moist and mucous membranes are normal. No oropharyngeal exudate, posterior oropharyngeal edema or posterior oropharyngeal erythema.  Eyes: Pupils are equal, round, and reactive to light. Conjunctivae and EOM are normal. No scleral icterus.  Neck: Normal range of motion. Neck supple. Carotid bruit is not present. No thyromegaly present.  Cardiovascular: Regular rhythm, normal heart sounds and intact distal pulses. Tachycardia present.  No murmur heard. Pulses:  Radial pulses are 2+ on the right side, and 2+ on the left side.  Pulmonary/Chest: Effort normal and breath sounds normal. No respiratory distress. He has no wheezes. He has no rales.  Abdominal: Soft. Bowel sounds are normal. He exhibits no distension and no mass. There is no tenderness. There is no rebound and no guarding.  Genitourinary: Rectum normal and prostate normal. Rectal exam shows no external hemorrhoid, no internal hemorrhoid, no fissure, no mass, no tenderness and anal tone normal. Prostate is not enlarged and not tender.  Musculoskeletal: Normal range of motion. He exhibits no edema.    Lymphadenopathy:    He has no cervical adenopathy.  Neurological: He is alert and oriented to person, place, and time.  CN grossly intact, station and gait intact  Skin: Skin is warm and dry. No rash noted.  Psychiatric: He has a normal mood and affect. His behavior is normal. Judgment and thought content normal.  Nursing note and vitals reviewed.  Results for orders placed or performed in visit on 12/17/17  POCT Urinalysis Dipstick (Automated)  Result Value Ref Range   Color, UA yellow    Clarity, UA clear    Glucose, UA Negative Negative   Bilirubin, UA negative    Ketones, UA negative    Spec Grav, UA 1.015 1.010 - 1.025   Blood, UA negative    pH, UA 6.0 5.0 - 8.0   Protein, UA Negative Negative   Urobilinogen, UA 0.2 0.2 or 1.0 E.U./dL   Nitrite, UA negative    Leukocytes, UA Negative Negative      Assessment & Plan:   Problem List Items Addressed This Visit    Polycythemia - Primary    New - check CXR and UA.  Non smoker No sxs OSA.       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 Has at home. Asked to bring copy       Other Visit Diagnoses    Need for influenza vaccination       Relevant Orders   Flu Vaccine QUAD 36+ mos IM (Completed)       No orders of the defined types were placed in this encounter.  Orders Placed This Encounter  Procedures  . DG Chest 2 View    Standing Status:   Future    Number of Occurrences:   1    Standing Expiration Date:   02/17/2019    Order Specific Question:   Reason for Exam (SYMPTOM  OR DIAGNOSIS REQUIRED)    Answer:   polycythemia    Order Specific Question:   Preferred imaging location?    Answer:   [facility]    Order Specific Question:   Radiology  Contrast Protocol - do NOT remove file path    Answer:   [email or web address]  . Flu Vaccine QUAD 36+ mos IM  . POCT Urinalysis Dipstick (Automated)    Follow up plan: Return in about 1 year (around 12/18/2018) for annual exam, prior fasting for blood work, follow up visit.  Ria Bush, MD

## 2017-12-17 NOTE — Patient Instructions (Addendum)
Flu shot today Xray and urinalysis today. Back off caffeine some.  We will sign you up for cologuard today - expect a kit in the mail in the next few weeks. If you don't receive anything, let us know.  If interested, check with pharmacy about new 2 shot shingles series (shingrix).  Bring me copy of your advanced directive.  Good to see you today, return as needed or in 1 year for next wellness visit.

## 2017-12-17 NOTE — Assessment & Plan Note (Addendum)
Recommend pneumococcal vaccine Q5 yrs

## 2017-12-17 NOTE — Assessment & Plan Note (Signed)
New - check CXR and UA.  Non smoker No sxs OSA.

## 2017-12-30 DIAGNOSIS — Z1211 Encounter for screening for malignant neoplasm of colon: Secondary | ICD-10-CM | POA: Diagnosis not present

## 2017-12-30 DIAGNOSIS — Z1212 Encounter for screening for malignant neoplasm of rectum: Secondary | ICD-10-CM | POA: Diagnosis not present

## 2018-01-03 LAB — COLOGUARD: Cologuard: NEGATIVE

## 2018-01-07 ENCOUNTER — Telehealth: Payer: Self-pay

## 2018-01-07 ENCOUNTER — Encounter: Payer: Self-pay | Admitting: Family Medicine

## 2018-01-07 NOTE — Telephone Encounter (Signed)
Spoke with pt relaying negative Cologuard results.  Pt verbalizes understanding.

## 2018-01-09 NOTE — Progress Notes (Signed)
I reviewed health advisor's note, was available for consultation, and agree with documentation and plan.  

## 2018-01-12 ENCOUNTER — Encounter: Payer: Self-pay | Admitting: Family Medicine

## 2018-01-12 ENCOUNTER — Ambulatory Visit (INDEPENDENT_AMBULATORY_CARE_PROVIDER_SITE_OTHER)
Admission: RE | Admit: 2018-01-12 | Discharge: 2018-01-12 | Disposition: A | Discharge status: Home / Self Care | Payer: Medicare Other | Source: Ambulatory Visit | Attending: Family Medicine | Admitting: Family Medicine

## 2018-01-12 ENCOUNTER — Ambulatory Visit (INDEPENDENT_AMBULATORY_CARE_PROVIDER_SITE_OTHER): Payer: Medicare Other | Admitting: Family Medicine

## 2018-01-12 ENCOUNTER — Encounter (INDEPENDENT_AMBULATORY_CARE_PROVIDER_SITE_OTHER): Payer: Self-pay

## 2018-01-12 VITALS — BP 120/70 | HR 102 | Temp 98.6°F | Ht 68.0 in | Wt 167.2 lb

## 2018-01-12 DIAGNOSIS — J189 Pneumonia, unspecified organism: Secondary | ICD-10-CM | POA: Insufficient documentation

## 2018-01-12 DIAGNOSIS — Z9081 Acquired absence of spleen: Secondary | ICD-10-CM

## 2018-01-12 DIAGNOSIS — J181 Lobar pneumonia, unspecified organism: Secondary | ICD-10-CM

## 2018-01-12 DIAGNOSIS — R05 Cough: Secondary | ICD-10-CM | POA: Diagnosis not present

## 2018-01-12 MED ORDER — CEFTRIAXONE SODIUM 1 G IJ SOLR
1.0000 g | Freq: Once | INTRAMUSCULAR | Status: AC
  Administered 2018-01-12: 1 g via INTRAMUSCULAR

## 2018-01-12 MED ORDER — AZITHROMYCIN 250 MG PO TABS: ORAL_TABLET | ORAL | 0 refills | Status: DC

## 2018-01-12 NOTE — Progress Notes (Signed)
BP 120/70 (BP Location: Left Arm, Patient Position: Sitting, Cuff Size: Normal)   Pulse (!) 102   Temp 98.6 F (37 C) (Oral)   Ht 5\' 8"  (1.727 m)   Wt 167 lb 4 oz (75.9 kg)   SpO2 96%   BMI 25.43 kg/m    CC: cough Subjective:    Patient ID: Dustin Cole, male    DOB: [birth year], 67 y.o.   MRN: [id number]  HPI: LUCCAS Cole is a 67 y.o. male presenting on 01/12/2018 for Cough (C/o dry cough started about 1 wk ago. Had stopped but came back yesterday. Cough is worse when lying down. Tried Mucinex, not helpful. )   Last week had URI illness. mucinex hasn't helped. Also treating with ricola. Symptoms initially improved but cough returned last night - worse when supine. Some HA from cough. Cough largely dry.   Denies fevers/chills, ear or tooth pain, ST or PNDrainage. No dyspnea or wheezing. No malaise.  May have cough illness at work.  Non smokers No h/o asthma.   H/o asplenia  Relevant past medical, surgical, family and social history reviewed and updated as indicated. Interim medical history since our last visit reviewed. Allergies and medications reviewed and updated. Outpatient Medications Prior to Visit  Medication Sig Dispense Refill  . aspirin 81 MG tablet Take 81 mg by mouth daily.    . Multiple Vitamin (MULTIVITAMIN) tablet Take 1 tablet by mouth daily.    . Multiple Vitamins-Minerals (PRESERVISION AREDS PO) Take by mouth daily.    . sildenafil (REVATIO) 20 MG tablet TAKE TWO TO FIVE TABLETS BY MOUTH DAILY AS NEEDED FOR RELATIONS 30 tablet 5  . naproxen (NAPROSYN) 500 MG tablet Take one po bid x 1 week then prn pain, take with food 40 tablet 0   No facility-administered medications prior to visit.      Per HPI unless specifically indicated in ROS section below Review of Systems     Objective:    BP 120/70 (BP Location: Left Arm, Patient Position: Sitting, Cuff Size: Normal)   Pulse (!) 102   Temp 98.6 F (37 C) (Oral)   Ht 5\' 8"  (1.727 m)   Wt 167 lb 4  oz (75.9 kg)   SpO2 96%   BMI 25.43 kg/m   Wt Readings from Last 3 Encounters:  01/12/18 167 lb 4 oz (75.9 kg)  12/17/17 169 lb 12 oz (77 kg)  12/10/17 168 lb 12 oz (76.5 kg)    Physical Exam  Constitutional: He appears well-developed and well-nourished. No distress.  HENT:  Head: Normocephalic and atraumatic.  Right Ear: Hearing, tympanic membrane, external ear and ear canal normal.  Left Ear: Hearing, tympanic membrane, external ear and ear canal normal.  Nose: Nose normal. No mucosal edema or rhinorrhea. Right sinus exhibits no maxillary sinus tenderness and no frontal sinus tenderness. Left sinus exhibits no maxillary sinus tenderness and no frontal sinus tenderness.  Mouth/Throat: Uvula is midline, oropharynx is clear and moist and mucous membranes are normal. No oropharyngeal exudate, posterior oropharyngeal edema, posterior oropharyngeal erythema or tonsillar abscesses.  Eyes: Pupils are equal, round, and reactive to light. Conjunctivae and EOM are normal. No scleral icterus.  Neck: Normal range of motion. Neck supple.  Cardiovascular: Normal rate, regular rhythm, normal heart sounds and intact distal pulses.  No murmur heard. Pulmonary/Chest: Effort normal. No respiratory distress. He has no wheezes. He has rales in the right middle field and the right lower field.  Lymphadenopathy:  He has no cervical adenopathy.  Skin: Skin is warm and dry. No rash noted.  Nursing note and vitals reviewed.  DG Chest 2 View CLINICAL DATA:  Polycythemia. Nonsmoker. No chest complaints per patient.  EXAM: CHEST - 2 VIEW  COMPARISON:  None.  FINDINGS: Heart, mediastinum and hila are within normal limits.  The lungs are clear.  No pleural effusion or pneumothorax.  Skeletal structures are intact.  IMPRESSION: No active cardiopulmonary disease.  Electronically Signed   By: Lajean Manes M.D.   On: 12/17/2017 14:03     Assessment & Plan:   Problem List Items Addressed This  Visit    Pneumonia of right lower lobe due to infectious organism ([place]) - Primary    Given lung findings and asplenia, concern for RLL CAP. Treat with rocephin 1gm IM today then zpack sent to pharmacy. Update if not improving with treatment. Pt agrees with plan. He is UTD on pneumococcal vaccines.       Relevant Medications   azithromycin (ZITHROMAX) 250 MG tablet   cefTRIAXone (ROCEPHIN) injection 1 g (Completed)   Other Relevant Orders   DG Chest 2 View   Asplenia after surgical procedure       Meds ordered this encounter  Medications  . azithromycin (ZITHROMAX) 250 MG tablet    Sig: Take two tablets on day one followed by one tablet on days 2-5    Dispense:  6 each    Refill:  0  . cefTRIAXone (ROCEPHIN) injection 1 g   Orders Placed This Encounter  Procedures  . DG Chest 2 View    Standing Status:   Future    Number of Occurrences:   1    Standing Expiration Date:   03/15/2019    Order Specific Question:   Reason for Exam (SYMPTOM  OR DIAGNOSIS REQUIRED)    Answer:   RLL rales, cough, asplenia    Order Specific Question:   Preferred imaging location?    Answer:   [facility]    Order Specific Question:   Radiology Contrast Protocol - do NOT remove file path    Answer:   [email or web address]    Follow up plan: Return if symptoms worsen or fail to improve.  Dustin Bush, MD

## 2018-01-12 NOTE — Assessment & Plan Note (Addendum)
Given lung findings and asplenia, concern for RLL CAP. Treat with rocephin 1gm IM today then zpack sent to pharmacy. Update if not improving with treatment. Pt agrees with plan. He is UTD on pneumococcal vaccines.

## 2018-01-12 NOTE — Patient Instructions (Addendum)
Xray today I think you have pneumonia - treat with shot of antibiotic today (rocephin) and then take zpack sent to pharmacy.  Push fluids and rest.  Out of work today - and take more time off if needed.   Community-Acquired Pneumonia, Adult Pneumonia is an infection of the lungs. One type of pneumonia can happen while a person is in a hospital. A different type can happen when a person is not in a hospital (community-acquired pneumonia). It is easy for this kind to spread from person to person. It can spread to you if you breathe near an infected person who coughs or sneezes. Some symptoms include:  A dry cough.  A wet (productive) cough.  Fever.  Sweating.  Chest pain.  Follow these instructions at home:  Take over-the-counter and prescription medicines only as told by your doctor. ? Only take cough medicine if you are losing sleep. ? If you were prescribed an antibiotic medicine, take it as told by your doctor. Do not stop taking the antibiotic even if you start to feel better.  Sleep with your head and neck raised (elevated). You can do this by putting a few pillows under your head, or you can sleep in a recliner.  Do not use tobacco products. These include cigarettes, chewing tobacco, and e-cigarettes. If you need help quitting, ask your doctor.  Drink enough water to keep your pee (urine) clear or pale yellow. A shot (vaccine) can help prevent pneumonia. Shots are often suggested for:  People older than 66 years of age.  People older than 66 years of age: ? Who are having cancer treatment. ? Who have long-term (chronic) lung disease. ? Who have problems with their body's defense system (immune system).  You may also prevent pneumonia if you take these actions:  Get the flu (influenza) shot every year.  Go to the dentist as often as told.  Wash your hands often. If soap and water are not available, use hand sanitizer.  Contact a doctor if:  You have a fever.  You  lose sleep because your cough medicine does not help. Get help right away if:  You are short of breath and it gets worse.  You have more chest pain.  Your sickness gets worse. This is very serious if: ? You are an older adult. ? Your body's defense system is weak.  You cough up blood. This information is not intended to replace advice given to you by your health care provider. Make sure you discuss any questions you have with your health care provider. Document Released: 09/16/2007 Document Revised: 09/05/2015 Document Reviewed: 07/25/2014 Elsevier Interactive Patient Education  [employer].

## 2018-06-20 DIAGNOSIS — D225 Melanocytic nevi of trunk: Secondary | ICD-10-CM | POA: Diagnosis not present

## 2018-06-20 DIAGNOSIS — L821 Other seborrheic keratosis: Secondary | ICD-10-CM | POA: Diagnosis not present

## 2018-07-19 ENCOUNTER — Other Ambulatory Visit: Payer: Self-pay | Admitting: Family Medicine

## 2018-07-19 NOTE — Telephone Encounter (Signed)
Electronic refill sildenafil Last office visit 01/12/18  Last refill 06/01/17 #30/5

## 2018-09-03 ENCOUNTER — Other Ambulatory Visit: Payer: Self-pay | Admitting: Family Medicine

## 2018-11-01 DIAGNOSIS — H25813 Combined forms of age-related cataract, bilateral: Secondary | ICD-10-CM | POA: Diagnosis not present

## 2018-11-09 ENCOUNTER — Ambulatory Visit (INDEPENDENT_AMBULATORY_CARE_PROVIDER_SITE_OTHER): Payer: Medicare Other

## 2018-11-09 DIAGNOSIS — Z111 Encounter for screening for respiratory tuberculosis: Secondary | ICD-10-CM

## 2018-11-09 NOTE — Progress Notes (Signed)
Per orders of Dr. Danise Mina, PPD placement given by Kris Mouton. Patient tolerated injection well.  Patient gets this done for work once a year. Patient instructed to come back on 11/11/2018 at the same time to have results read. Patient will need a note with his results for work at that time to take with him.

## 2018-11-11 LAB — TB SKIN TEST
Induration: 0 mm
TB Skin Test: NEGATIVE

## 2018-12-05 ENCOUNTER — Other Ambulatory Visit: Payer: Self-pay | Admitting: Family Medicine

## 2018-12-22 ENCOUNTER — Other Ambulatory Visit: Payer: Self-pay | Admitting: Family Medicine

## 2018-12-22 DIAGNOSIS — D751 Secondary polycythemia: Secondary | ICD-10-CM

## 2018-12-22 DIAGNOSIS — Z1211 Encounter for screening for malignant neoplasm of colon: Secondary | ICD-10-CM

## 2018-12-22 DIAGNOSIS — D580 Hereditary spherocytosis: Secondary | ICD-10-CM

## 2018-12-22 DIAGNOSIS — Z125 Encounter for screening for malignant neoplasm of prostate: Secondary | ICD-10-CM

## 2018-12-22 DIAGNOSIS — E785 Hyperlipidemia, unspecified: Secondary | ICD-10-CM

## 2018-12-23 ENCOUNTER — Other Ambulatory Visit: Payer: Medicare Other

## 2018-12-23 ENCOUNTER — Ambulatory Visit: Payer: Medicare Other

## 2018-12-23 ENCOUNTER — Ambulatory Visit (INDEPENDENT_AMBULATORY_CARE_PROVIDER_SITE_OTHER): Payer: Medicare Other

## 2018-12-23 ENCOUNTER — Other Ambulatory Visit (INDEPENDENT_AMBULATORY_CARE_PROVIDER_SITE_OTHER): Payer: Medicare Other

## 2018-12-23 VITALS — Ht 67.0 in | Wt 165.0 lb

## 2018-12-23 DIAGNOSIS — E785 Hyperlipidemia, unspecified: Secondary | ICD-10-CM | POA: Diagnosis not present

## 2018-12-23 DIAGNOSIS — Z1211 Encounter for screening for malignant neoplasm of colon: Secondary | ICD-10-CM

## 2018-12-23 DIAGNOSIS — D751 Secondary polycythemia: Secondary | ICD-10-CM

## 2018-12-23 DIAGNOSIS — Z125 Encounter for screening for malignant neoplasm of prostate: Secondary | ICD-10-CM

## 2018-12-23 DIAGNOSIS — Z0000 Encounter for general adult medical examination without abnormal findings: Secondary | ICD-10-CM

## 2018-12-23 LAB — CBC WITH DIFFERENTIAL/PLATELET
Absolute Monocytes: 874 cells/uL (ref 200–950)
Basophils Absolute: 68 cells/uL (ref 0–200)
Basophils Relative: 0.9 %
Eosinophils Absolute: 182 cells/uL (ref 15–500)
Eosinophils Relative: 2.4 %
HCT: 47.4 % (ref 38.5–50.0)
Hemoglobin: 17 g/dL (ref 13.2–17.1)
Lymphs Abs: 1611 cells/uL (ref 850–3900)
MCH: 30.9 pg (ref 27.0–33.0)
MCHC: 35.9 g/dL (ref 32.0–36.0)
MCV: 86 fL (ref 80.0–100.0)
MPV: 10.4 fL (ref 7.5–12.5)
Monocytes Relative: 11.5 %
Neutro Abs: 4864 cells/uL (ref 1500–7800)
Neutrophils Relative %: 64 %
Platelets: 373 10*3/uL (ref 140–400)
RBC: 5.51 10*6/uL (ref 4.20–5.80)
RDW: 13.1 % (ref 11.0–15.0)
Total Lymphocyte: 21.2 %
WBC: 7.6 10*3/uL (ref 3.8–10.8)

## 2018-12-23 LAB — LIPID PANEL
Cholesterol: 189 mg/dL (ref 0–200)
HDL: 40.8 mg/dL (ref >39.00)
LDL Cholesterol: 127 mg/dL — ABNORMAL HIGH (ref 0–99)
NonHDL: 148.22
Total CHOL/HDL Ratio: 5
Triglycerides: 106 mg/dL (ref 0.0–149.0)
VLDL: 21.2 mg/dL (ref 0.0–40.0)

## 2018-12-23 LAB — BASIC METABOLIC PANEL
BUN: 10 mg/dL (ref 6–23)
CO2: 29 mEq/L (ref 19–32)
Calcium: 8.9 mg/dL (ref 8.4–10.5)
Chloride: 103 mEq/L (ref 96–112)
Creatinine, Ser: 0.83 mg/dL (ref 0.40–1.50)
GFR: 92.36 mL/min (ref >60.00)
Glucose, Bld: 87 mg/dL (ref 70–99)
Potassium: 4.1 mEq/L (ref 3.5–5.1)
Sodium: 140 mEq/L (ref 135–145)

## 2018-12-23 LAB — PSA, MEDICARE: PSA: 2.08 ng/ml (ref 0.10–4.00)

## 2018-12-23 NOTE — Patient Instructions (Signed)
[name] , Thank you for taking time to come for your Medicare Wellness Visit. I appreciate your ongoing commitment to your health goals. Please review the following plan we discussed and let me know if I can assist you in the future.   Screening recommendations/referrals: Colonoscopy: cologuard 12/2017 Recommended yearly ophthalmology/optometry visit for glaucoma screening and checkup Recommended yearly dental visit for hygiene and checkup  Vaccinations: Influenza vaccine: 12/2017 Pneumococcal vaccine: 11/2016 Tdap vaccine: 12/2013 Shingles vaccine: discussed    Advanced directives: Please bring a copy of your POA (Power of [place]) and/or Living Will to your next appointment.    Conditions/risks identified: overweight  Next appointment: 12/30/2018 at 7:30  Preventive Care 21 Years and Older, Male Preventive care refers to lifestyle choices and visits with your health care provider that can promote health and wellness. What does preventive care include?  A yearly physical exam. This is also called an annual well check.  Dental exams once or twice a year.  Routine eye exams. Ask your health care provider how often you should have your eyes checked.  Personal lifestyle choices, including:  Daily care of your teeth and gums.  Regular physical activity.  Eating a healthy diet.  Avoiding tobacco and drug use.  Limiting alcohol use.  Practicing safe sex.  Taking low doses of aspirin every day.  Taking vitamin and mineral supplements as recommended by your health care provider. What happens during an annual well check? The services and screenings done by your health care provider during your annual well check will depend on your age, overall health, lifestyle risk factors, and family history of disease. Counseling  Your health care provider may ask you questions about your:  Alcohol use.  Tobacco use.  Drug use.  Emotional well-being.  Home and relationship  well-being.  Sexual activity.  Eating habits.  History of falls.  Memory and ability to understand (cognition).  Work and work Statistician. Screening  You may have the following tests or measurements:  Height, weight, and BMI.  Blood pressure.  Lipid and cholesterol levels. These may be checked every 5 years, or more frequently if you are over 68 years old.  Skin check.  Lung cancer screening. You may have this screening every year starting at age 58 if you have a 30-pack-year history of smoking and currently smoke or have quit within the past 15 years.  Fecal occult blood test (FOBT) of the stool. You may have this test every year starting at age 43.  Flexible sigmoidoscopy or colonoscopy. You may have a sigmoidoscopy every 5 years or a colonoscopy every 10 years starting at age 44.  Prostate cancer screening. Recommendations will vary depending on your family history and other risks.  Hepatitis C blood test.  Hepatitis B blood test.  Sexually transmitted disease (STD) testing.  Diabetes screening. This is done by checking your blood sugar (glucose) after you have not eaten for a while (fasting). You may have this done every 1-3 years.  Abdominal aortic aneurysm (AAA) screening. You may need this if you are a current or former smoker.  Osteoporosis. You may be screened starting at age 25 if you are at high risk. Talk with your health care provider about your test results, treatment options, and if necessary, the need for more tests. Vaccines  Your health care provider may recommend certain vaccines, such as:  Influenza vaccine. This is recommended every year.  Tetanus, diphtheria, and acellular pertussis (Tdap, Td) vaccine. You may need a Td booster  every 10 years.  Zoster vaccine. You may need this after age 18.  Pneumococcal 13-valent conjugate (PCV13) vaccine. One dose is recommended after age 67.  Pneumococcal polysaccharide (PPSV23) vaccine. One dose is  recommended after age 15. Talk to your health care provider about which screenings and vaccines you need and how often you need them. This information is not intended to replace advice given to you by your health care provider. Make sure you discuss any questions you have with your health care provider. Document Released: 04/26/2015 Document Revised: 12/18/2015 Document Reviewed: 01/29/2015 Elsevier Interactive Patient Education  2017 [place] Prevention in the Home Falls can cause injuries. They can happen to people of all ages. There are many things you can do to make your home safe and to help prevent falls. What can I do on the outside of my home?  Regularly fix the edges of walkways and driveways and fix any cracks.  Remove anything that might make you trip as you walk through a door, such as a raised step or threshold.  Trim any bushes or trees on the path to your home.  Use bright outdoor lighting.  Clear any walking paths of anything that might make someone trip, such as rocks or tools.  Regularly check to see if handrails are loose or broken. Make sure that both sides of any steps have handrails.  Any raised decks and porches should have guardrails on the edges.  Have any leaves, snow, or ice cleared regularly.  Use sand or salt on walking paths during winter.  Clean up any spills in your garage right away. This includes oil or grease spills. What can I do in the bathroom?  Use night lights.  Install grab bars by the toilet and in the tub and shower. Do not use towel bars as grab bars.  Use non-skid mats or decals in the tub or shower.  If you need to sit down in the shower, use a plastic, non-slip stool.  Keep the floor dry. Clean up any water that spills on the floor as soon as it happens.  Remove soap buildup in the tub or shower regularly.  Attach bath mats securely with double-sided non-slip rug tape.  Do not have throw rugs and other things on  the floor that can make you trip. What can I do in the bedroom?  Use night lights.  Make sure that you have a light by your bed that is easy to reach.  Do not use any sheets or blankets that are too big for your bed. They should not hang down onto the floor.  Have a firm chair that has side arms. You can use this for support while you get dressed.  Do not have throw rugs and other things on the floor that can make you trip. What can I do in the kitchen?  Clean up any spills right away.  Avoid walking on wet floors.  Keep items that you use a lot in easy-to-reach places.  If you need to reach something above you, use a strong step stool that has a grab bar.  Keep electrical cords out of the way.  Do not use floor polish or wax that makes floors slippery. If you must use wax, use non-skid floor wax.  Do not have throw rugs and other things on the floor that can make you trip. What can I do with my stairs?  Do not leave any items on the stairs.  Make  sure that there are handrails on both sides of the stairs and use them. Fix handrails that are broken or loose. Make sure that handrails are as long as the stairways.  Check any carpeting to make sure that it is firmly attached to the stairs. Fix any carpet that is loose or worn.  Avoid having throw rugs at the top or bottom of the stairs. If you do have throw rugs, attach them to the floor with carpet tape.  Make sure that you have a light switch at the top of the stairs and the bottom of the stairs. If you do not have them, ask someone to add them for you. What else can I do to help prevent falls?  Wear shoes that:  Do not have high heels.  Have rubber bottoms.  Are comfortable and fit you well.  Are closed at the toe. Do not wear sandals.  If you use a stepladder:  Make sure that it is fully opened. Do not climb a closed stepladder.  Make sure that both sides of the stepladder are locked into place.  Ask someone to  hold it for you, if possible.  Clearly mark and make sure that you can see:  Any grab bars or handrails.  First and last steps.  Where the edge of each step is.  Use tools that help you move around (mobility aids) if they are needed. These include:  Canes.  Walkers.  Scooters.  Crutches.  Turn on the lights when you go into a dark area. Replace any light bulbs as soon as they burn out.  Set up your furniture so you have a clear path. Avoid moving your furniture around.  If any of your floors are uneven, fix them.  If there are any pets around you, be aware of where they are.  Review your medicines with your doctor. Some medicines can make you feel dizzy. This can increase your chance of falling. Ask your doctor what other things that you can do to help prevent falls. This information is not intended to replace advice given to you by your health care provider. Make sure you discuss any questions you have with your health care provider. Document Released: 01/24/2009 Document Revised: 09/05/2015 Document Reviewed: 05/04/2014 Elsevier Interactive Patient Education  2017 [employer].

## 2018-12-23 NOTE — Progress Notes (Signed)
PCP notes:  Health Maintenance:  Flu and PPSV23 vaccines are due. Appears patient has already had a PPSV23 in the past.  Abnormal Screenings:  None  Patient concerns:  None  Nurse concerns:  None  Next PCP appt.: 12/30/2018 at 7:30

## 2018-12-23 NOTE — Progress Notes (Signed)
Subjective:   Dustin Cole is a 68 y.o. male who presents for Medicare Annual/Subsequent preventive examination.  This visit type was conducted due to national recommendations for restrictions regarding the COVID-19 Pandemic (e.g. social distancing). This format is felt to be most appropriate for this patient at this time. All issues noted in this document were discussed and addressed. No physical exam was performed (except for noted visual exam findings with Video Visits). This patient, [name], has given permission to perform this visit via telephone. Vital signs may be absent or patient reported.  Patient location:  At home  Nurse location:  At home     Review of Systems:  n/a Cardiac Risk Factors include: advanced age (>49men, >55 women);male gender     Objective:    Vitals: Ht 5\' 7"  (1.702 m) Comment: per patient  Wt 165 lb (74.8 kg) Comment: per patient  BMI 25.84 kg/m   Body mass index is 25.84 kg/m.  Advanced Directives 12/23/2018 12/10/2017  Does Patient Have a Medical Advance Directive? Yes Yes  Type of Paramedic of [place];Living will [place];Living will  Copy of [place] in Chart? No - copy requested No - copy requested    Tobacco Social History   Tobacco Use  Smoking Status Never Smoker  Smokeless Tobacco Never Used     Counseling given: Not Answered   Clinical Intake:  Pre-visit preparation completed: Yes  Pain : No/denies pain     Nutritional Status: BMI 25 -29 Overweight Nutritional Risks: None Diabetes: No  How often do you need to have someone help you when you read instructions, pamphlets, or other written materials from your doctor or pharmacy?: 1 - Never What is the last grade level you completed in school?: some college  Interpreter Needed?: No  Information entered by :: NAllen LPN  Past Medical History:  Diagnosis Date  . Hereditary spherocytosis ([place])     s/p splenectomy as teen  . History of chicken pox    Past Surgical History:  Procedure Laterality Date  . COLONOSCOPY  2007   WNL per prior PCP records  . SPLENECTOMY  1970s   congenital spherocytosis   Family History  Problem Relation Age of Onset  . Cancer Mother        breast  . Clotting disorder Brother        ?hereditary spherocytosis  . Cancer Father 57       prostate  . CAD Father 68       CABG  . Stroke Neg Hx   . Diabetes Neg Hx   . Hypertension Neg Hx    Social History   Socioeconomic History  . Marital status: Married    Spouse name: Not on file  . Number of children: Not on file  . Years of education: Not on file  . Highest education level: Not on file  Occupational History  . Not on file  Social Needs  . Financial resource strain: Not hard at all  . Food insecurity    Worry: Never true    Inability: Never true  . Transportation needs    Medical: No    Non-medical: No  Tobacco Use  . Smoking status: Never Smoker  . Smokeless tobacco: Never Used  Substance and Sexual Activity  . Alcohol use: No    Alcohol/week: 0.0 standard drinks  . Drug use: No  . Sexual activity: Yes  Lifestyle  . Physical activity  Days per week: 2 days    Minutes per session: 40 min  . Stress: Not at all  Relationships  . Social Herbalist on phone: Not on file    Gets together: Not on file    Attends religious service: Not on file    Active member of club or organization: Not on file    Attends meetings of clubs or organizations: Not on file    Relationship status: Not on file  Other Topics Concern  . 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 aspirin 81 MG tablet Take 81 mg by mouth daily.  . Multiple Vitamin  (MULTIVITAMIN) tablet Take 1 tablet by mouth daily.  . Multiple Vitamins-Minerals (PRESERVISION AREDS PO) Take by mouth daily.  . sildenafil (REVATIO) 20 MG tablet TAKE 2 TO 5 TABLETS BY MOUTH ONCE DAILY AS NEEDED FOR  RELATIONS  . azithromycin (ZITHROMAX) 250 MG tablet Take two tablets on day one followed by one tablet on days 2-5 (Patient not taking: Reported on 12/23/2018)   No facility-administered encounter medications on file as of 12/23/2018.     Activities of Daily Living In your present state of health, do you have any difficulty performing the following activities: 12/23/2018  Hearing? N  Vision? N  Difficulty concentrating or making decisions? N  Walking or climbing stairs? N  Dressing or bathing? N  Doing errands, shopping? Y  Preparing Food and eating ? N  Using the Toilet? N  In the past six months, have you accidently leaked urine? N  Do you have problems with loss of bowel control? N  Managing your Medications? N  Managing your Finances? N  Housekeeping or managing your Housekeeping? N  Some recent data might be hidden    Patient Care Team: Ria Bush, MD as PCP - General (Family Medicine)   Assessment:   This is a routine wellness examination for Dustin Cole.  Exercise Activities and Dietary recommendations Current Exercise Habits: The patient has a physically strenuous job, but has no regular exercise apart from work.  Goals    . DIET - INCREASE WATER INTAKE     Starting 12/10/2017, I will continue to drink at least 6-8 glasses of water daily.    . Weight (lb) < 200 lb (90.7 kg)     12/23/2018, wants to weigh 155 pounds       Fall Risk Fall Risk  12/23/2018 12/10/2017 12/04/2016 05/18/2016  Falls in the past year? 0 No No No  Follow up Falls evaluation completed;Falls prevention discussed - - -   Is the patient's home free of loose throw rugs in walkways, pet beds, electrical cords, etc?   yes      Grab bars in the bathroom? no      Handrails on the  stairs?   yes      Adequate lighting?   yes  Timed Get Up and Go Performed: n/a  Depression Screen PHQ 2/9 Scores 12/23/2018 12/10/2017 12/04/2016 05/18/2016  PHQ - 2 Score 0 0 0 0  PHQ- 9 Score 0 0 - -    Cognitive Function MMSE - Mini Mental State Exam 12/23/2018 12/10/2017  Orientation to time 5 5  Orientation to Place 5 5  Registration 3 3  Attention/ Calculation 5 0  Recall 3 3  Language- name 2 objects 0 0  Language- repeat 1 1  Language- follow 3 step command 0 3  Language- read & follow direction 0 0  Write a sentence 0 0  Copy design 0 0  Total score 22 20   Mini Cog  Mini-Cog screen was completed. Maximum score is 22. A value of 0 denotes this part of the MMSE was not completed or the patient failed this part of the Mini-Cog screening.       Immunization History  Administered Date(s) Administered  . Influenza,inj,Quad PF,6+ Mos 01/19/2017, 12/17/2017  . Influenza-Unspecified 01/15/2014, 01/09/2015, 01/12/2016  . PPD Test 07/27/2014, 07/23/2015, 09/15/2016, 09/29/2017, 11/09/2018  . Pneumococcal Conjugate-13 12/04/2016  . Pneumococcal Polysaccharide-23 12/22/2013  . Tdap 12/22/2013  . Zoster 12/16/2012    Qualifies for Shingles Vaccine? yes  Screening Tests Health Maintenance  Topic Date Due  . INFLUENZA VACCINE  11/12/2018  . PNA vac Low Risk Adult (2 of 2 - PPSV23) 12/23/2018  . Fecal DNA (Cologuard)  01/03/2021  . DTaP/Tdap/Td (2 - Td) 12/23/2023  . TETANUS/TDAP  12/23/2023  . Hepatitis C Screening  Completed   Cancer Screenings: Lung: Low Dose CT Chest recommended if Age 43-80 years, 30 pack-year currently smoking OR have quit w/in 15years. Patient does not qualify. Colorectal: cologuard  Additional Screenings:  Hepatitis C Screening:04/2016      Plan:    Patient wants to weigh 155 pounds.  I have personally reviewed and noted the following in the patient's chart:   . Medical and social history . Use of alcohol, tobacco or illicit drugs  .  Current medications and supplements . Functional ability and status . Nutritional status . Physical activity . Advanced directives . List of other physicians . Hospitalizations, surgeries, and ER visits in previous 12 months . Vitals . Screenings to include cognitive, depression, and falls . Referrals and appointments  In addition, I have reviewed and discussed with patient certain preventive protocols, quality metrics, and best practice recommendations. A written personalized care plan for preventive services as well as general preventive health recommendations were provided to patient.     Kellie Simmering, LPN  [id number]

## 2018-12-30 ENCOUNTER — Ambulatory Visit (INDEPENDENT_AMBULATORY_CARE_PROVIDER_SITE_OTHER): Payer: Medicare Other | Admitting: Family Medicine

## 2018-12-30 ENCOUNTER — Other Ambulatory Visit: Payer: Self-pay

## 2018-12-30 ENCOUNTER — Encounter: Payer: Self-pay | Admitting: Family Medicine

## 2018-12-30 VITALS — BP 118/76 | HR 99 | Temp 97.8°F | Ht 67.0 in | Wt 170.4 lb

## 2018-12-30 DIAGNOSIS — D751 Secondary polycythemia: Secondary | ICD-10-CM | POA: Diagnosis not present

## 2018-12-30 DIAGNOSIS — D580 Hereditary spherocytosis: Secondary | ICD-10-CM | POA: Diagnosis not present

## 2018-12-30 DIAGNOSIS — Z23 Encounter for immunization: Secondary | ICD-10-CM

## 2018-12-30 DIAGNOSIS — Z9081 Acquired absence of spleen: Secondary | ICD-10-CM | POA: Diagnosis not present

## 2018-12-30 DIAGNOSIS — Z7189 Other specified counseling: Secondary | ICD-10-CM

## 2018-12-30 NOTE — Assessment & Plan Note (Signed)
This has resolved.

## 2018-12-30 NOTE — Assessment & Plan Note (Signed)
Advanced directive: scanned in chart 12/2017. Wife Broderick Collamore then niece Ziare Befort are HCPOA. Does not want life prolonging measures if terminally ill.

## 2018-12-30 NOTE — Progress Notes (Signed)
This visit was conducted in person.  BP 118/76 (BP Location: Left Arm, Patient Position: Sitting, Cuff Size: Normal)   Pulse 99   Temp 97.8 F (36.6 C) (Temporal)   Ht 5\' 7"  (1.702 m)   Wt 170 lb 7 oz (77.3 kg)   SpO2 95%   BMI 26.69 kg/m    CC: CPE Subjective:    Patient ID: Dustin Dustin, male    DOB: 11/10/1950, 68 y.o.   MRN: [id number]  HPI: Dustin Dustin is a 68 y.o. male presenting on 12/30/2018 for Annual Exam (Prt 2. )   Saw health advisor last week for medicare wellness visit. Note reviewed.   Preventative: Colonoscopy ~71yrs ago with [facility]. cologuard negative 2019 Prostate cancer screening - yearly.  Flu shot yearly Pneumovax 12/2013. Prevnar 2018. Needs pneumovax Q5 yrs due to h/o splenectomy. Tdap 12/2013 Zostavax with Dustin Dustin thinks 2015 Shingrix - discussed Advanced directive: scanned in chart 12/2017. Wife Dustin Dustin then niece Dustin Dustin are HCPOA. Does not want life prolonging measures if terminally ill.  Seat belt use discussed Sunscreen use discussed, denies changing moles. Non smoker  Alcohol - none  Dentist Q6 mo Eye exam - yearly Bowel - no constipation Bladder - no incontinence  Caffeine: 3 coffee daily Lives with wife, dogs  Occupation: tile  Activity: active labor, active around the house, cuts wood, exercise bike and walking Diet: good water, fruit/svegetables daily, some chicken and fish     Relevant past medical, surgical, family and social history reviewed and updated as indicated. Interim medical history since our last visit reviewed. Allergies and medications reviewed and updated. Outpatient Medications Prior to Visit  Medication Sig Dispense Refill  . aspirin 81 MG tablet Take 81 mg by mouth daily.    . Multiple Vitamin (MULTIVITAMIN) tablet Take 1 tablet by mouth daily.    . Multiple Vitamins-Minerals (PRESERVISION AREDS PO) Take by mouth daily.    . sildenafil (REVATIO) 20 MG tablet TAKE 2 TO 5 TABLETS BY MOUTH ONCE  DAILY AS NEEDED FOR  RELATIONS 30 tablet 3  . azithromycin (ZITHROMAX) 250 MG tablet Take two tablets on day one followed by one tablet on days 2-5 (Patient not taking: Reported on 12/23/2018) 6 each 0   No facility-administered medications prior to visit.      Per HPI unless specifically indicated in ROS section below Review of Systems Objective:    BP 118/76 (BP Location: Left Arm, Patient Position: Sitting, Cuff Size: Normal)   Pulse 99   Temp 97.8 F (36.6 C) (Temporal)   Ht 5\' 7"  (1.702 m)   Wt 170 lb 7 oz (77.3 kg)   SpO2 95%   BMI 26.69 kg/m   Wt Readings from Last 3 Encounters:  12/30/18 170 lb 7 oz (77.3 kg)  12/23/18 165 lb (74.8 kg)  01/12/18 167 lb 4 oz (75.9 kg)    Physical Exam Vitals signs and nursing note reviewed.  Constitutional:      General: He is not in acute distress.    Appearance: Normal appearance. He is well-developed. He is not ill-appearing.  HENT:     Head: Normocephalic and atraumatic.     Right Ear: Hearing, tympanic membrane, ear canal and external ear normal.     Left Ear: Hearing, tympanic membrane, ear canal and external ear normal.     Nose: Nose normal.     Mouth/Throat:     Mouth: Mucous membranes are moist.     Pharynx: Uvula midline. No  oropharyngeal exudate or posterior oropharyngeal erythema.  Eyes:     General: No scleral icterus.    Extraocular Movements: Extraocular movements intact.     Conjunctiva/sclera: Conjunctivae normal.     Pupils: Pupils are equal, round, and reactive to light.  Neck:     Musculoskeletal: Normal range of motion and neck supple.     Vascular: No carotid bruit.  Cardiovascular:     Rate and Rhythm: Normal rate and regular rhythm.     Pulses: Normal pulses.          Radial pulses are 2+ on the right side and 2+ on the left side.     Heart sounds: Normal heart sounds. No murmur.  Pulmonary:     Effort: Pulmonary effort is normal. No respiratory distress.     Breath sounds: Normal breath sounds. No  wheezing, rhonchi or rales.  Abdominal:     General: Abdomen is flat. Bowel sounds are normal. There is no distension.     Palpations: Abdomen is soft. There is no mass.     Tenderness: There is no abdominal tenderness. There is no guarding or rebound.     Hernia: No hernia is present.  Musculoskeletal: Normal range of motion.     Right lower leg: No edema.     Left lower leg: No edema.  Lymphadenopathy:     Cervical: No cervical adenopathy.  Skin:    General: Skin is warm and dry.     Findings: No rash.     Comments: No atypical moles  Neurological:     General: No focal deficit present.     Mental Status: He is alert and oriented to person, place, and time.     Comments: CN grossly intact, station and gait intact  Psychiatric:        Mood and Affect: Mood normal.        Behavior: Behavior normal.        Thought Content: Thought content normal.        Judgment: Judgment normal.       Results for orders placed or performed in visit on 12/23/18  CBC with Differential  Result Value Ref Range   WBC 7.6 3.8 - 10.8 Thousand/uL   RBC 5.51 4.20 - 5.80 Million/uL   Hemoglobin 17.0 13.2 - 17.1 g/dL   HCT 47.4 38.5 - 50.0 %   MCV 86.0 80.0 - 100.0 fL   MCH 30.9 27.0 - 33.0 pg   MCHC 35.9 32.0 - 36.0 g/dL   RDW 13.1 11.0 - 15.0 %   Platelets 373 140 - 400 Thousand/uL   MPV 10.4 7.5 - 12.5 fL   Neutro Abs 4,864 1,500 - 7,800 cells/uL   Lymphs Abs 1,611 850 - 3,900 cells/uL   Absolute Monocytes 874 200 - 950 cells/uL   Eosinophils Absolute 182 15 - 500 cells/uL   Basophils Absolute 68 0 - 200 cells/uL   Neutrophils Relative % 64 %   Total Lymphocyte 21.2 %   Monocytes Relative 11.5 %   Eosinophils Relative 2.4 %   Basophils Relative 0.9 %   Assessment & Plan:   Problem List Items Addressed This Visit    Polycythemia    This has resolved      Hereditary spherocytosis ([place])    S/p splenectomy      Asplenia after surgical procedure   Advanced care planning/counseling  discussion - Primary    Advanced directive: scanned in chart 12/2017. Wife Dustin Dustin then niece Dustin Dustin  Dustin Dustin are [employer]. Does not want life prolonging measures if terminally ill.        Other Visit Diagnoses    Need for influenza vaccination       Relevant Orders   Flu Vaccine QUAD High Dose(Fluad) (Completed)       No orders of the defined types were placed in this encounter.  Orders Placed This Encounter  Procedures  . Flu Vaccine QUAD High Dose(Fluad)   Patient instructions: Flu shot today Pneumonia shot next year.  If interested, check with pharmacy about new 2 shot shingles series (shingrix).  You are doing well today Return as needed or in 1 year for next physical.   Follow up plan: Return in about 1 year (around 12/30/2019), or if symptoms worsen or fail to improve, for medicare wellness visit, follow up visit.  Ria Bush, MD

## 2018-12-30 NOTE — Assessment & Plan Note (Signed)
S/p splenectomy 

## 2018-12-30 NOTE — Patient Instructions (Addendum)
Flu shot today Pneumonia shot next year.  If interested, check with pharmacy about new 2 shot shingles series (shingrix).  You are doing well today Return as needed or in 1 year for next physical.   Health Maintenance After Age 67 After age 32, you are at a higher risk for certain long-term diseases and infections as well as injuries from falls. Falls are a major cause of broken bones and head injuries in people who are older than age 74. Getting regular preventive care can help to keep you healthy and well. Preventive care includes getting regular testing and making lifestyle changes as recommended by your health care provider. Talk with your health care provider about:  Which screenings and tests you should have. A screening is a test that checks for a disease when you have no symptoms.  A diet and exercise plan that is right for you. What should I know about screenings and tests to prevent falls? Screening and testing are the best ways to find a health problem early. Early diagnosis and treatment give you the best chance of managing medical conditions that are common after age 105. Certain conditions and lifestyle choices may make you more likely to have a fall. Your health care provider may recommend:  Regular vision checks. Poor vision and conditions such as cataracts can make you more likely to have a fall. If you wear glasses, make sure to get your prescription updated if your vision changes.  Medicine review. Work with your health care provider to regularly review all of the medicines you are taking, including over-the-counter medicines. Ask your health care provider about any side effects that may make you more likely to have a fall. Tell your health care provider if any medicines that you take make you feel dizzy or sleepy.  Osteoporosis screening. Osteoporosis is a condition that causes the bones to get weaker. This can make the bones weak and cause them to break more easily.  Blood  pressure screening. Blood pressure changes and medicines to control blood pressure can make you feel dizzy.  Strength and balance checks. Your health care provider may recommend certain tests to check your strength and balance while standing, walking, or changing positions.  Foot health exam. Foot pain and numbness, as well as not wearing proper footwear, can make you more likely to have a fall.  Depression screening. You may be more likely to have a fall if you have a fear of falling, feel emotionally low, or feel unable to do activities that you used to do.  Alcohol use screening. Using too much alcohol can affect your balance and may make you more likely to have a fall. What actions can I take to lower my risk of falls? General instructions  Talk with your health care provider about your risks for falling. Tell your health care provider if: ? You fall. Be sure to tell your health care provider about all falls, even ones that seem minor. ? You feel dizzy, sleepy, or off-balance.  Take over-the-counter and prescription medicines only as told by your health care provider. These include any supplements.  Eat a healthy diet and maintain a healthy weight. A healthy diet includes low-fat dairy products, low-fat (lean) meats, and fiber from whole grains, beans, and lots of fruits and vegetables. Home safety  Remove any tripping hazards, such as rugs, cords, and clutter.  Install safety equipment such as grab bars in bathrooms and safety rails on stairs.  Keep rooms and walkways well-lit.  Activity   Follow a regular exercise program to stay fit. This will help you maintain your balance. Ask your health care provider what types of exercise are appropriate for you.  If you need a cane or walker, use it as recommended by your health care provider.  Wear supportive shoes that have nonskid soles. Lifestyle  Do not drink alcohol if your health care provider tells you not to drink.  If you  drink alcohol, limit how much you have: ? 0-1 drink a day for women. ? 0-2 drinks a day for men.  Be aware of how much alcohol is in your drink. In the [place] one drink equals one typical bottle of beer (12 oz), one-half glass of wine (5 oz), or one shot of hard liquor (1 oz).  Do not use any products that contain nicotine or tobacco, such as cigarettes and e-cigarettes. If you need help quitting, ask your health care provider. Summary  Having a healthy lifestyle and getting preventive care can help to protect your health and wellness after age 33.  Screening and testing are the best way to find a health problem early and help you avoid having a fall. Early diagnosis and treatment give you the best chance for managing medical conditions that are more common for people who are older than age 52.  Falls are a major cause of broken bones and head injuries in people who are older than age 7. Take precautions to prevent a fall at home.  Work with your health care provider to learn what changes you can make to improve your health and wellness and to prevent falls. This information is not intended to replace advice given to you by your health care provider. Make sure you discuss any questions you have with your health care provider. Document Released: 02/10/2017 Document Revised: 07/21/2018 Document Reviewed: 02/10/2017 Elsevier Patient Education  2020 [employer].

## 2019-03-31 DIAGNOSIS — Z20828 Contact with and (suspected) exposure to other viral communicable diseases: Secondary | ICD-10-CM | POA: Diagnosis not present

## 2019-04-04 ENCOUNTER — Telehealth: Payer: Self-pay | Admitting: Family Medicine

## 2019-04-04 NOTE — Telephone Encounter (Signed)
Will you schedule a virtual visit with another provider?  Dr. Darnell Level does not have a 30 min slot.

## 2019-04-04 NOTE — Telephone Encounter (Signed)
Pt diagnosed with Covid, tested on 03/31/2019 Pt c/o Fatigue, Eye redness/irritated/soreness - right eye, Weakness  Denies fever, cough, congestion, runny nose, sinus congestion, chills or SOB  Patient taking Tylenol for eye pain.  Is there anything else the patient can take?   Pt states that he seems to be getting worse rather than better.

## 2019-04-05 ENCOUNTER — Other Ambulatory Visit: Payer: Self-pay

## 2019-04-05 ENCOUNTER — Ambulatory Visit (INDEPENDENT_AMBULATORY_CARE_PROVIDER_SITE_OTHER): Payer: Medicare Other | Admitting: Family Medicine

## 2019-04-05 ENCOUNTER — Encounter: Payer: Self-pay | Admitting: Family Medicine

## 2019-04-05 DIAGNOSIS — U071 COVID-19: Secondary | ICD-10-CM

## 2019-04-05 DIAGNOSIS — L03213 Periorbital cellulitis: Secondary | ICD-10-CM | POA: Diagnosis not present

## 2019-04-05 DIAGNOSIS — H109 Unspecified conjunctivitis: Secondary | ICD-10-CM | POA: Diagnosis not present

## 2019-04-05 MED ORDER — AMOXICILLIN-POT CLAVULANATE 875-125 MG PO TABS: 1.0000 | ORAL_TABLET | Freq: Two times a day (BID) | ORAL | 0 refills | Status: AC

## 2019-04-05 MED ORDER — POLYMYXIN B-TRIMETHOPRIM 10000-0.1 UNIT/ML-% OP SOLN: 1.0000 [drp] | OPHTHALMIC | 0 refills | Status: AC

## 2019-04-05 NOTE — Progress Notes (Signed)
Dustin Cole T. Tokiko Diefenderfer, MD Primary Care and Sports Medicine [facility] at [facility] [place], [address] Phone: [phone]  FAX: [phone]  Dustin Cole - 68 y.o. male  MRN [id number]  Date of Birth: 12-27-1950  Visit Date: 04/05/2019  PCP: Ria Bush, MD  Referred by: Ria Bush, MD No chief complaint on file.  Virtual Visit via Video Note:  I connected with  Everlean Patterson on 04/05/2019  9:00 AM EST by a video enabled telemedicine application and verified that I am speaking with the correct person using two identifiers.   Location patient: home computer, tablet, or smartphone Location provider: work or home office Consent: Verbal consent directly obtained from [employer]. Persons participating in the virtual visit: patient, provider  I discussed the limitations of evaluation and management by telemedicine and the availability of in person appointments. The patient expressed understanding and agreed to proceed.  History of Present Illness:  He is a pleasant gentleman and he calls then and he has a known positive COVID-19 test.  This is already been discussed with him and he does have a 10-day window where he is going to quarantine.  He has taken some Tylenol, but he is having some still some discomfort.  Primarily he is calling in with some significant eye pain and pain around the eye and tenderness.  He also has significant bloodshot in the right eye.  There is none on the left.  He has no known trauma.  Review of Systems as above: See pertinent positives and pertinent negatives per HPI No acute distress verbally  Past Medical History, Surgical History, Social History, Family History, Problem List, Medications, and Allergies have been reviewed and updated if relevant.   Observations/Objective/Exam:  An attempt was made to discern vital signs over the phone and per patient if applicable and possible.    General:    Alert, Oriented, appears well and in no acute distress HEENT:     Atraumatic, conjunctiva clear, no obvious abnormalities on inspection of external nose and ears.  Neck:    Normal movements of the head and neck Pulmonary:     On inspection no signs of respiratory distress, breathing rate appears normal, no obvious gross SOB, gasping or wheezing Cardiovascular:    No obvious cyanosis Musculoskeletal:    Moves all visible extremities without noticeable abnormality Psych / Neurological:     Pleasant and cooperative, no obvious depression or anxiety, speech and thought processing grossly intact  Assessment and Plan:    ICD-10-CM   1. Periorbital cellulitis of right eye  L03.213   2. Conjunctivitis of right eye, unspecified conjunctivitis type  H10.9   3. COVID-19  U07.1    Level of Medical Decision-Making in this case is Moderate.  Based on history as well as visual examination through telehealth, the patient appears to have preorbital versus periorbital cellulitis.  This is on top of COVID-19.  He also has some significant injected conjunctive on the right.  I am going to place the patient on Augmentin and additionally place him on Polytrim.  We reviewed potential risks in the future if failure occurs, and he would need to seek urgent evaluation in the ER with possible ophthalmological consultation.  I discussed the assessment and treatment plan with the patient. The patient was provided an opportunity to ask questions and all were answered. The patient agreed with the plan and demonstrated an understanding of the instructions.   The  patient was advised to call back or seek an in-person evaluation if the symptoms worsen or if the condition fails to improve as anticipated.  Follow-up: prn unless noted otherwise below No follow-ups on file.  Meds ordered this encounter  Medications  . amoxicillin-clavulanate (AUGMENTIN) 875-125 MG tablet    Sig: Take 1 tablet by mouth  2 (two) times daily for 10 days.    Dispense:  20 tablet    Refill:  0  . trimethoprim-polymyxin b (POLYTRIM) ophthalmic solution    Sig: Place 1 drop into the left eye every 4 (four) hours for 7 days.    Dispense:  10 mL    Refill:  0   No orders of the defined types were placed in this encounter.   Signed,  Maud Deed. Vernal Hritz, MD

## 2019-09-05 ENCOUNTER — Ambulatory Visit: Payer: Medicare Other | Admitting: Family Medicine

## 2019-09-05 DIAGNOSIS — Z0289 Encounter for other administrative examinations: Secondary | ICD-10-CM

## 2019-09-12 ENCOUNTER — Ambulatory Visit: Payer: Medicare Other

## 2019-09-13 ENCOUNTER — Ambulatory Visit: Payer: Medicare Other

## 2019-09-13 ENCOUNTER — Telehealth: Payer: Self-pay

## 2019-09-13 NOTE — Telephone Encounter (Signed)
Noted  

## 2019-09-13 NOTE — Telephone Encounter (Signed)
I noticed the pt was on the nurse schedule this afternoon at 345 for a TB Skin Test. We do not usually do them on Wednesdays, especially that late in the day. I spoke to [place], Therapist, sports. She suggested I contact the pt and find out why the pt is having a tb skin test. I don't see any documentation why he needs it and he is 69 years old. Why is he requesting this test and should it be a quantiferon gold test or tb skin test. If skin test let him know it will need to be RS unless he absolutely has to have it this week and explain to him of the timing requirements.  If he really needs it let me know and I will bring it up to Dr. Darnell Level.   I spoke to pt. We do it every year for him for work. Just a PPD Placement. He was able to move it to Tuesday, June 15th. Will send to Dr Danise Mina as Juluis Rainier.

## 2019-09-23 ENCOUNTER — Other Ambulatory Visit: Payer: Self-pay | Admitting: Family Medicine

## 2019-09-26 ENCOUNTER — Other Ambulatory Visit: Payer: Self-pay

## 2019-09-26 ENCOUNTER — Ambulatory Visit (INDEPENDENT_AMBULATORY_CARE_PROVIDER_SITE_OTHER): Payer: Medicare Other

## 2019-09-26 DIAGNOSIS — Z111 Encounter for screening for respiratory tuberculosis: Secondary | ICD-10-CM | POA: Diagnosis not present

## 2019-09-26 NOTE — Progress Notes (Signed)
PPD Placement note Dustin Cole, 69 y.o. male is here today for placement of PPD test Reason for PPD test: work Pt taken PPD test before: yes Verified in allergy area and with patient that they are not allergic to the products PPD is made of (Phenol or Tween). Yes Is patient taking any oral or IV steroid medication now or have they taken it in the last month? no Has the patient ever received the BCG vaccine?: no Has the patient been in recent contact with anyone known or suspected of having active TB disease?: no      Date of exposure (if applicable): N/A      Name of person they were exposed to (if applicable): N/A Patient's Country of origin?: N/A O: Alert and oriented in NAD. P:  PPD placed on 09/26/2019.  Patient advised to return for reading within 48-72 hours.   PPD placed in L forearm.

## 2019-09-28 ENCOUNTER — Other Ambulatory Visit: Payer: Self-pay

## 2019-09-28 ENCOUNTER — Ambulatory Visit: Payer: Medicare Other

## 2019-09-28 LAB — TB SKIN TEST
Induration: 0 mm
TB Skin Test: NEGATIVE

## 2019-09-28 NOTE — Progress Notes (Signed)
PPD Reading Note  PPD read and results entered in [place]. Result: 0 mm induration. Interpretation: Negative  Allergic reaction: no   Varney Daily, CMA 09/28/19

## 2019-11-08 ENCOUNTER — Other Ambulatory Visit: Payer: Self-pay | Admitting: Family Medicine

## 2019-11-16 DIAGNOSIS — H25813 Combined forms of age-related cataract, bilateral: Secondary | ICD-10-CM | POA: Diagnosis not present

## 2019-12-29 ENCOUNTER — Telehealth: Payer: Self-pay

## 2019-12-29 ENCOUNTER — Ambulatory Visit: Payer: Medicare Other

## 2019-12-29 ENCOUNTER — Other Ambulatory Visit: Payer: Self-pay | Admitting: Family Medicine

## 2019-12-29 ENCOUNTER — Other Ambulatory Visit: Payer: Self-pay

## 2019-12-29 ENCOUNTER — Other Ambulatory Visit: Payer: Medicare Other

## 2019-12-29 ENCOUNTER — Other Ambulatory Visit (INDEPENDENT_AMBULATORY_CARE_PROVIDER_SITE_OTHER): Payer: Medicare Other

## 2019-12-29 DIAGNOSIS — Z125 Encounter for screening for malignant neoplasm of prostate: Secondary | ICD-10-CM | POA: Diagnosis not present

## 2019-12-29 DIAGNOSIS — D751 Secondary polycythemia: Secondary | ICD-10-CM

## 2019-12-29 DIAGNOSIS — D580 Hereditary spherocytosis: Secondary | ICD-10-CM | POA: Diagnosis not present

## 2019-12-29 DIAGNOSIS — E785 Hyperlipidemia, unspecified: Secondary | ICD-10-CM | POA: Diagnosis not present

## 2019-12-29 LAB — CBC WITH DIFFERENTIAL/PLATELET
Absolute Monocytes: 905 cells/uL (ref 200–950)
Basophils Absolute: 70 cells/uL (ref 0–200)
Basophils Relative: 0.9 %
Eosinophils Absolute: 140 cells/uL (ref 15–500)
Eosinophils Relative: 1.8 %
HCT: 48.5 % (ref 38.5–50.0)
Hemoglobin: 17.4 g/dL — ABNORMAL HIGH (ref 13.2–17.1)
Lymphs Abs: 1404 cells/uL (ref 850–3900)
MCH: 31 pg (ref 27.0–33.0)
MCHC: 35.9 g/dL (ref 32.0–36.0)
MCV: 86.5 fL (ref 80.0–100.0)
MPV: 10.3 fL (ref 7.5–12.5)
Monocytes Relative: 11.6 %
Neutro Abs: 5281 cells/uL (ref 1500–7800)
Neutrophils Relative %: 67.7 %
Platelets: 410 10*3/uL — ABNORMAL HIGH (ref 140–400)
RBC: 5.61 10*6/uL (ref 4.20–5.80)
RDW: 13 % (ref 11.0–15.0)
Total Lymphocyte: 18 %
WBC: 7.8 10*3/uL (ref 3.8–10.8)

## 2019-12-29 LAB — COMPREHENSIVE METABOLIC PANEL
ALT: 23 U/L (ref 0–53)
AST: 22 U/L (ref 0–37)
Albumin: 4.3 g/dL (ref 3.5–5.2)
Alkaline Phosphatase: 77 U/L (ref 39–117)
BUN: 13 mg/dL (ref 6–23)
CO2: 32 mEq/L (ref 19–32)
Calcium: 9.1 mg/dL (ref 8.4–10.5)
Chloride: 101 mEq/L (ref 96–112)
Creatinine, Ser: 0.92 mg/dL (ref 0.40–1.50)
GFR: 81.77 mL/min (ref >60.00)
Glucose, Bld: 82 mg/dL (ref 70–99)
Potassium: 4.3 mEq/L (ref 3.5–5.1)
Sodium: 140 mEq/L (ref 135–145)
Total Bilirubin: 1.4 mg/dL — ABNORMAL HIGH (ref 0.2–1.2)
Total Protein: 6.7 g/dL (ref 6.0–8.3)

## 2019-12-29 LAB — PSA, MEDICARE: PSA: 1.81 ng/ml (ref 0.10–4.00)

## 2019-12-29 LAB — LIPID PANEL
Cholesterol: 210 mg/dL — ABNORMAL HIGH (ref 0–200)
HDL: 40 mg/dL (ref >39.00)
LDL Cholesterol: 139 mg/dL — ABNORMAL HIGH (ref 0–99)
NonHDL: 169.95
Total CHOL/HDL Ratio: 5
Triglycerides: 156 mg/dL — ABNORMAL HIGH (ref 0.0–149.0)
VLDL: 31.2 mg/dL (ref 0.0–40.0)

## 2019-12-29 NOTE — Progress Notes (Deleted)
Subjective:   Dustin Cole is a 69 y.o. male who presents for Medicare Annual/Subsequent preventive examination.  Review of Systems: N/A      I connected with the patient today by telephone and verified that I am speaking with the correct person using two identifiers. Location patient: home Location nurse: work Persons participating in the telephone visit: patient, nurse.   I discussed the limitations, risks, security and privacy concerns of performing an evaluation and management service by telephone and the availability of in person appointments. I also discussed with the patient that there may be a patient responsible charge related to this service. The patient expressed understanding and verbally consented to this telephonic visit.              Objective:    There were no vitals filed for this visit. There is no height or weight on file to calculate BMI.  Advanced Directives 12/23/2018 12/10/2017  Does Patient Have a Medical Advance Directive? Yes Yes  Type of Paramedic of [employer];Living will [place];Living will  Copy of [place] in Chart? No - copy requested No - copy requested    Current Medications (verified) Outpatient Encounter Medications as of 12/29/2019  Medication Sig  . aspirin 81 MG tablet Take 81 mg by mouth daily.  . Multiple Vitamin (MULTIVITAMIN) tablet Take 1 tablet by mouth daily.  . Multiple Vitamins-Minerals (PRESERVISION AREDS PO) Take by mouth daily.  . sildenafil (REVATIO) 20 MG tablet TAKE 2 TO 5 TABLETS BY MOUTH ONCE DAILY AS NEEDED FOR  RELATIONS   No facility-administered encounter medications on file as of 12/29/2019.    Allergies (verified) Patient has no known allergies.   History: Past Medical History:  Diagnosis Date  . Hereditary spherocytosis ([place])    s/p splenectomy as teen  . History of chicken pox    Past Surgical History:  Procedure Laterality Date  .  COLONOSCOPY  2007   WNL per prior PCP records  . SPLENECTOMY  1970s   congenital spherocytosis   Family History  Problem Relation Age of Onset  . Cancer Mother        breast  . Clotting disorder Brother        ?hereditary spherocytosis  . Cancer Father 40       prostate  . CAD Father 75       CABG  . Stroke Neg Hx   . Diabetes Neg Hx   . Hypertension Neg Hx    Social History   Socioeconomic History  . Marital status: Married    Spouse name: Not on file  . Number of children: Not on file  . Years of education: Not on file  . Highest education level: Not on file  Occupational History  . Not on file  Tobacco Use  . Smoking status: Never Smoker  . Smokeless tobacco: Never Used  Vaping Use  . Vaping Use: Never used  Substance and Sexual Activity  . Alcohol use: No    Alcohol/week: 0.0 standard drinks  . Drug use: No  . Sexual activity: Yes  Other Topics Concern  . 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 Difficulty of Paying Living  Expenses: Not on file  Food Insecurity:   . Worried About Charity fundraiser in the Last Year: Not on file  . Ran Out of Food in the Last Year: Not on file  Transportation Needs:   . Lack of Transportation (Medical): Not on file  . Lack of Transportation (Non-Medical): Not on file  Physical Activity:   . Days of Exercise per Week: Not on file  . Minutes of Exercise per Session: Not on file  Stress:   . Feeling of Stress : Not on file  Social Connections:   . Frequency of Communication with Friends and Family: Not on file  . Frequency of Social Gatherings with Friends and Family: Not on file  . Attends Religious Services: Not on file  . Active Member of Clubs or Organizations: Not on file  . Attends English as a second language teacher Meetings: Not on file  . Marital Status: Not on file    Tobacco Counseling Counseling given: Not Answered   Clinical Intake:                 Diabetic: No Nutrition Risk Assessment:  Has the patient had any N/V/D within the last 2 months?  {YES/NO:21197} Does the patient have any non-healing wounds?  {YES/NO:21197} Has the patient had any unintentional weight loss or weight gain?  {YES/NO:21197}  Diabetes:  Is the patient diabetic?  No  If diabetic, was a CBG obtained today?  N/A Did the patient bring in their glucometer from home?  N/A How often do you monitor your CBG's? N/A.   Financial Strains and Diabetes Management:  Are you having any financial strains with the device, your supplies or your medication? N/A.  Does the patient want to be seen by Chronic Care Management for management of their diabetes?  N/A Would the patient like to be referred to a Nutritionist or for Diabetic Management?  N/A           Activities of Daily Living No flowsheet data found.  Patient Care Team: Ria Bush, MD as PCP - General (Family Medicine)  Indicate any recent Medical Services you may have received from other than [facility] providers in the past year (date may be approximate).     Assessment:   This is a routine wellness examination for Dustin Cole.  Hearing/Vision screen No exam data present  Dietary issues and exercise activities discussed:    Goals    . DIET - INCREASE WATER INTAKE     Starting 12/10/2017, I will continue to drink at least 6-8 glasses of water daily.    . Weight (lb) < 200 lb (90.7 kg)     12/23/2018, wants to weigh 155 pounds      Depression Screen PHQ 2/9 Scores 12/23/2018 12/10/2017 12/04/2016 05/18/2016  PHQ - 2 Score 0 0 0 0  PHQ- 9 Score 0 0 - -    Fall Risk Fall Risk  12/23/2018 12/10/2017 12/04/2016 05/18/2016  Falls in the past year? 0 No No No  Follow up Falls evaluation completed;Falls prevention discussed - - -     Any stairs in or around the home? Yes  If so, are there any without handrails? No  Home free of loose throw rugs in walkways, pet beds, electrical cords, etc? Yes  Adequate lighting in your home to reduce risk of falls? Yes   ASSISTIVE DEVICES UTILIZED TO PREVENT FALLS:  Life alert? {YES/NO:21197} Use of a cane, walker or w/c? {YES/NO:21197} Grab bars in the bathroom? {YES/NO:21197} Shower  chair or bench in shower? {YES/NO:21197} Elevated toilet seat or a handicapped toilet? {YES/NO:21197}  TIMED UP AND GO:  Was the test performed? N/A, telephonic visit.    Cognitive Function: MMSE - Mini Mental State Exam 12/23/2018 12/10/2017  Orientation to time 5 5  Orientation to Place 5 5  Registration 3 3  Attention/ Calculation 5 0  Recall 3 3  Language- name 2 objects 0 0  Language- repeat 1 1  Language- follow 3 step command 0 3  Language- read & follow direction 0 0  Write a sentence 0 0  Copy design 0 0  Total score 22 20  Mini Cog  Mini-Cog screen was completed. Maximum score is 22. A value of 0 denotes this part of the MMSE was not completed or the patient failed this part of the Mini-Cog screening.       Immunizations Immunization History  Administered Date(s) Administered  . Fluad Quad(high Dose 65+) 12/30/2018  . Influenza,inj,Quad PF,6+ Mos 01/19/2017, 12/17/2017  . Influenza-Unspecified 01/15/2014, 01/09/2015, 01/12/2016  . PPD Test 07/27/2014, 07/23/2015, 09/15/2016, 09/29/2017, 11/09/2018, 09/26/2019  . Pneumococcal Conjugate-13 12/04/2016  . Pneumococcal Polysaccharide-23 12/22/2013  . Tdap 12/22/2013  . Zoster 12/16/2012    TDAP status: Up to date {Flu Vaccine status:2101806} {Pneumococcal vaccine status:2101807} {Covid-19 vaccine status:2101808}  Qualifies for Shingles Vaccine? Yes   Zostavax completed Yes   Shingrix Completed?: No.    Education has been provided regarding the importance of this vaccine. Patient has been advised to call insurance  company to determine out of pocket expense if they have not yet received this vaccine. Advised may also receive vaccine at local pharmacy or Health Dept. Verbalized acceptance and understanding.  Screening Tests Health Maintenance  Topic Date Due  . Meningococcal B Vaccine (1 of 4 - Increased Risk Bexsero 2-dose series) Never done  . COVID-19 Vaccine (1) Never done  . PNA vac Low Risk Adult (2 of 2 - PPSV23) 12/23/2018  . INFLUENZA VACCINE  11/12/2019  . Fecal DNA (Cologuard)  01/03/2021  . TETANUS/TDAP  12/23/2023  . Hepatitis C Screening  Completed    Health Maintenance  Health Maintenance Due  Topic Date Due  . Meningococcal B Vaccine (1 of 4 - Increased Risk Bexsero 2-dose series) Never done  . COVID-19 Vaccine (1) Never done  . PNA vac Low Risk Adult (2 of 2 - PPSV23) 12/23/2018  . INFLUENZA VACCINE  11/12/2019    Colorectal cancer screening: Completed cologuard 01/03/2018. Repeat every 3 years  Lung Cancer Screening: (Low Dose CT Chest recommended if Age 24-80 years, 30 pack-year currently smoking OR have quit w/in 15 years.) does not qualify.   Additional Screening:  Hepatitis C Screening: does qualify; Completed 04/24/2016  Vision Screening: Recommended annual ophthalmology exams for early detection of glaucoma and other disorders of the eye. Is the patient up to date with their annual eye exam?  {YES/NO:21197} Who is the provider or what is the name of the office in which the patient attends annual eye exams? *** If pt is not established with a provider, would they like to be referred to a provider to establish care? No .   Dental Screening: Recommended annual dental exams for proper oral hygiene  Community Resource Referral / Chronic Care Management: CRR required this visit?  No   CCM required this visit?  No      Plan:     I have personally reviewed and noted the following in the patient's chart:   . Medical and  social history . Use of alcohol, tobacco or  illicit drugs  . Current medications and supplements . Functional ability and status . Nutritional status . Physical activity . Advanced directives . List of other physicians . Hospitalizations, surgeries, and ER visits in previous 12 months . Vitals . Screenings to include cognitive, depression, and falls . Referrals and appointments  In addition, I have reviewed and discussed with patient certain preventive protocols, quality metrics, and best practice recommendations. A written personalized care plan for preventive services as well as general preventive health recommendations were provided to patient.   Due to this being a telephonic visit, the after visit summary with patients personalized plan was offered to patient via mail or my-chart. Patient preferred to pick up at office at next visit.   Andrez Grime, LPN   3/54/3014

## 2019-12-29 NOTE — Telephone Encounter (Signed)
Called patient about 3 times trying to complete his Medicare visit. Patient never answered. Left message notifying patient that appointment was cancelled and to call and reschedule or provider might complete at upcoming physical.

## 2020-01-05 ENCOUNTER — Ambulatory Visit (INDEPENDENT_AMBULATORY_CARE_PROVIDER_SITE_OTHER): Payer: Medicare Other | Admitting: Family Medicine

## 2020-01-05 ENCOUNTER — Other Ambulatory Visit: Payer: Self-pay

## 2020-01-05 ENCOUNTER — Encounter: Payer: Self-pay | Admitting: Family Medicine

## 2020-01-05 VITALS — BP 120/80 | HR 79 | Temp 97.6°F | Ht 67.0 in | Wt 168.2 lb

## 2020-01-05 DIAGNOSIS — N529 Male erectile dysfunction, unspecified: Secondary | ICD-10-CM

## 2020-01-05 DIAGNOSIS — D751 Secondary polycythemia: Secondary | ICD-10-CM | POA: Diagnosis not present

## 2020-01-05 DIAGNOSIS — D580 Hereditary spherocytosis: Secondary | ICD-10-CM | POA: Diagnosis not present

## 2020-01-05 DIAGNOSIS — E785 Hyperlipidemia, unspecified: Secondary | ICD-10-CM | POA: Diagnosis not present

## 2020-01-05 DIAGNOSIS — Z0000 Encounter for general adult medical examination without abnormal findings: Secondary | ICD-10-CM | POA: Insufficient documentation

## 2020-01-05 NOTE — Assessment & Plan Note (Addendum)
S/p splenectomy. Discussed pneumovax - asks to postpone to next year.

## 2020-01-05 NOTE — Patient Instructions (Addendum)
If interested, check with pharmacy about new 2 shot shingles series (shingrix).  Work on low cholesterol diet. Good to see you today Return as needed or in 1 year for next physical.   Health Maintenance After Age 68 After age 9, you are at a higher risk for certain long-term diseases and infections as well as injuries from falls. Falls are a major cause of broken bones and head injuries in people who are older than age 65. Getting regular preventive care can help to keep you healthy and well. Preventive care includes getting regular testing and making lifestyle changes as recommended by your health care provider. Talk with your health care provider about:  Which screenings and tests you should have. A screening is a test that checks for a disease when you have no symptoms.  A diet and exercise plan that is right for you. What should I know about screenings and tests to prevent falls? Screening and testing are the best ways to find a health problem early. Early diagnosis and treatment give you the best chance of managing medical conditions that are common after age 49. Certain conditions and lifestyle choices may make you more likely to have a fall. Your health care provider may recommend:  Regular vision checks. Poor vision and conditions such as cataracts can make you more likely to have a fall. If you wear glasses, make sure to get your prescription updated if your vision changes.  Medicine review. Work with your health care provider to regularly review all of the medicines you are taking, including over-the-counter medicines. Ask your health care provider about any side effects that may make you more likely to have a fall. Tell your health care provider if any medicines that you take make you feel dizzy or sleepy.  Osteoporosis screening. Osteoporosis is a condition that causes the bones to get weaker. This can make the bones weak and cause them to break more easily.  Blood pressure  screening. Blood pressure changes and medicines to control blood pressure can make you feel dizzy.  Strength and balance checks. Your health care provider may recommend certain tests to check your strength and balance while standing, walking, or changing positions.  Foot health exam. Foot pain and numbness, as well as not wearing proper footwear, can make you more likely to have a fall.  Depression screening. You may be more likely to have a fall if you have a fear of falling, feel emotionally low, or feel unable to do activities that you used to do.  Alcohol use screening. Using too much alcohol can affect your balance and may make you more likely to have a fall. What actions can I take to lower my risk of falls? General instructions  Talk with your health care provider about your risks for falling. Tell your health care provider if: ? You fall. Be sure to tell your health care provider about all falls, even ones that seem minor. ? You feel dizzy, sleepy, or off-balance.  Take over-the-counter and prescription medicines only as told by your health care provider. These include any supplements.  Eat a healthy diet and maintain a healthy weight. A healthy diet includes low-fat dairy products, low-fat (lean) meats, and fiber from whole grains, beans, and lots of fruits and vegetables. Home safety  Remove any tripping hazards, such as rugs, cords, and clutter.  Install safety equipment such as grab bars in bathrooms and safety rails on stairs.  Keep rooms and walkways well-lit. Activity  Follow a regular exercise program to stay fit. This will help you maintain your balance. Ask your health care provider what types of exercise are appropriate for you.  If you need a cane or walker, use it as recommended by your health care provider.  Wear supportive shoes that have nonskid soles. Lifestyle  Do not drink alcohol if your health care provider tells you not to drink.  If you drink  alcohol, limit how much you have: ? 0-1 drink a day for women. ? 0-2 drinks a day for men.  Be aware of how much alcohol is in your drink. In the [place] one drink equals one typical bottle of beer (12 oz), one-half glass of wine (5 oz), or one shot of hard liquor (1 oz).  Do not use any products that contain nicotine or tobacco, such as cigarettes and e-cigarettes. If you need help quitting, ask your health care provider. Summary  Having a healthy lifestyle and getting preventive care can help to protect your health and wellness after age 88.  Screening and testing are the best way to find a health problem early and help you avoid having a fall. Early diagnosis and treatment give you the best chance for managing medical conditions that are more common for people who are older than age 70.  Falls are a major cause of broken bones and head injuries in people who are older than age 46. Take precautions to prevent a fall at home.  Work with your health care provider to learn what changes you can make to improve your health and wellness and to prevent falls. This information is not intended to replace advice given to you by your health care provider. Make sure you discuss any questions you have with your health care provider. Document Revised: 07/21/2018 Document Reviewed: 02/10/2017 Elsevier Patient Education  2020 [employer].

## 2020-01-05 NOTE — Assessment & Plan Note (Signed)
Mild, with mild thrombocytosis noted today.  Declines OSA symptoms.  Consider periph smear next year

## 2020-01-05 NOTE — Assessment & Plan Note (Signed)
Stable period - doing well on generic sildenafil

## 2020-01-05 NOTE — Assessment & Plan Note (Signed)

## 2020-01-05 NOTE — Progress Notes (Signed)
This visit was conducted in person.  BP 120/80 (BP Location: Left Arm, Patient Position: Sitting, Cuff Size: Normal)   Pulse 79   Temp 97.6 F (36.4 C) (Temporal)   Ht 5\' 7"  (1.702 m)   Wt 168 lb 4 oz (76.3 kg)   SpO2 96%   BMI 26.35 kg/m    CC: AMW Subjective:    Patient ID: Dustin Cole, male    DOB: 1951-03-03, 69 y.o.   MRN: [id number]  HPI: Dustin Cole is a 69 y.o. male presenting on 01/05/2020 for Medicare Wellness   Did not see health advisor    Hearing Screening   125Hz  250Hz  500Hz  1000Hz  2000Hz  3000Hz  4000Hz  6000Hz  8000Hz   Right ear:   20 40 25  0    Left ear:   25 25 40  0    Vision Screening Comments: Last eye exam, 10/2019.    Office Visit from 01/05/2020 in [place] [facility]  PHQ-2 Total Score 0      Fall Risk  12/23/2018 12/10/2017 12/04/2016 05/18/2016  Falls in the past year? 0 No No No  Follow up Falls evaluation completed;Falls prevention discussed - - -    Had COVID infection 03/2019.   Preventative: Colonoscopy ~47yrs ago with [facility].Cologuard negative 2019.  Prostate cancer screening - yearly.  Flu shot yearly Pneumovax 12/2013. Prevnar2018. Consider pneumovax Q5 yrs due to h/o splenectomy.  COVID vaccine - discussed, declines  Tdap 12/2013  Zostavax with [facility] 12/2012  Shingrix - discussed  Advanced directive: scanned in chart 12/2017. Wife Brandis Matsuura then niece Kenson Groh are HCPOA. Does not want life prolonging measures if terminally ill.  Seat belt use discussed Sunscreen use discussed, denies changing moles. Non smoker  Alcohol - none Dentist Q6 mo Eye exam - yearly Bowel - no constipation/diarrhea Bladder - no incontinence  Caffeine: 3 coffee daily Lives with wife, dogs  Occupation: tile  Activity: active labor, active around the house, cuts wood, exercise bike and walking Diet: good water, fruit/svegetables daily, some chicken and fish     Relevant past medical, surgical, family and social history  reviewed and updated as indicated. Interim medical history since our last visit reviewed. Allergies and medications reviewed and updated. Outpatient Medications Prior to Visit  Medication Sig Dispense Refill  . aspirin 81 MG tablet Take 81 mg by mouth daily.    . Multiple Vitamin (MULTIVITAMIN) tablet Take 1 tablet by mouth daily.    . Multiple Vitamins-Minerals (PRESERVISION AREDS PO) Take by mouth daily.    . sildenafil (REVATIO) 20 MG tablet TAKE 2 TO 5 TABLETS BY MOUTH ONCE DAILY AS NEEDED FOR  RELATIONS 30 tablet 2   No facility-administered medications prior to visit.     Per HPI unless specifically indicated in ROS section below Review of Systems Objective:  BP 120/80 (BP Location: Left Arm, Patient Position: Sitting, Cuff Size: Normal)   Pulse 79   Temp 97.6 F (36.4 C) (Temporal)   Ht 5\' 7"  (1.702 m)   Wt 168 lb 4 oz (76.3 kg)   SpO2 96%   BMI 26.35 kg/m   Wt Readings from Last 3 Encounters:  01/05/20 168 lb 4 oz (76.3 kg)  12/30/18 170 lb 7 oz (77.3 kg)  12/23/18 165 lb (74.8 kg)      Physical Exam Vitals and nursing note reviewed.  Constitutional:      General: He is not in acute distress.    Appearance: Normal appearance. He is well-developed.  He is not ill-appearing.  HENT:     Head: Normocephalic and atraumatic.     Right Ear: Hearing, tympanic membrane, ear canal and external ear normal.     Left Ear: Hearing, tympanic membrane, ear canal and external ear normal.  Eyes:     General: No scleral icterus.    Extraocular Movements: Extraocular movements intact.     Conjunctiva/sclera: Conjunctivae normal.     Pupils: Pupils are equal, round, and reactive to light.  Neck:     Thyroid: No thyroid mass or thyromegaly.     Vascular: No carotid bruit.  Cardiovascular:     Rate and Rhythm: Normal rate and regular rhythm.     Pulses: Normal pulses.          Radial pulses are 2+ on the right side and 2+ on the left side.     Heart sounds: Normal heart sounds. No  murmur heard.   Pulmonary:     Effort: Pulmonary effort is normal. No respiratory distress.     Breath sounds: Normal breath sounds. No wheezing, rhonchi or rales.  Abdominal:     General: Abdomen is flat. Bowel sounds are normal. There is no distension.     Palpations: Abdomen is soft. There is no mass.     Tenderness: There is no abdominal tenderness. There is no guarding or rebound.     Hernia: No hernia is present.  Musculoskeletal:        General: Normal range of motion.     Cervical back: Normal range of motion and neck supple.     Right lower leg: No edema.     Left lower leg: No edema.  Lymphadenopathy:     Cervical: No cervical adenopathy.  Skin:    General: Skin is warm and dry.     Findings: No rash.  Neurological:     General: No focal deficit present.     Mental Status: He is alert and oriented to person, place, and time.     Comments:  CN grossly intact, station and gait intact Recall 3/3 Calculation 5/5 DLROW  Psychiatric:        Mood and Affect: Mood normal.        Behavior: Behavior normal.        Thought Content: Thought content normal.        Judgment: Judgment normal.       Results for orders placed or performed in visit on 12/29/19  CBC with Differential/Platelet  Result Value Ref Range   WBC 7.8 3.8 - 10.8 Thousand/uL   RBC 5.61 4.20 - 5.80 Million/uL   Hemoglobin 17.4 (H) 13.2 - 17.1 g/dL   HCT 48.5 38 - 50 %   MCV 86.5 80.0 - 100.0 fL   MCH 31.0 27.0 - 33.0 pg   MCHC 35.9 32.0 - 36.0 g/dL   RDW 13.0 11.0 - 15.0 %   Platelets 410 (H) 140 - 400 Thousand/uL   MPV 10.3 7.5 - 12.5 fL   Neutro Abs 5,281 1,500 - 7,800 cells/uL   Lymphs Abs 1,404 850 - 3,900 cells/uL   Absolute Monocytes 905 200 - 950 cells/uL   Eosinophils Absolute 140 15 - 500 cells/uL   Basophils Absolute 70 0 - 200 cells/uL   Neutrophils Relative % 67.7 %   Total Lymphocyte 18.0 %   Monocytes Relative 11.6 %   Eosinophils Relative 1.8 %   Basophils Relative 0.9 %    Assessment & Plan:  This visit occurred  during the SARS-CoV-2 public health emergency.  Safety protocols were in place, including screening questions prior to the visit, additional usage of staff PPE, and extensive cleaning of exam room while observing appropriate contact time as indicated for disinfecting solutions.   Problem List Items Addressed This Visit    Polycythemia    Mild, with mild thrombocytosis noted today.  Declines OSA symptoms.  Consider periph smear next year       Medicare annual wellness visit, subsequent - Primary    I have personally reviewed the Medicare Annual Wellness questionnaire and have noted 1. The patient's medical and social history 2. Their use of alcohol, tobacco or illicit drugs 3. Their current medications and supplements 4. The patient's functional ability including ADL's, fall risks, home safety risks and hearing or visual impairment. Cognitive function has been assessed and addressed as indicated.  5. Diet and physical activity 6. Evidence for depression or mood disorders The patients weight, height, BMI have been recorded in the chart. I have made referrals, counseling and provided education to the patient based on review of the above and I have provided the pt with a written personalized care plan for preventive services. Provider list updated.. See scanned questionairre as needed for further documentation. Reviewed preventative protocols and updated unless pt declined.       Hereditary spherocytosis ([place])    S/p splenectomy. Discussed pneumovax - asks to postpone to next year.       Erectile dysfunction    Stable period - doing well on generic sildenafil       Dyslipidemia    Chronic, not on statin. Reviewed ASCVD risk. Declines statin. The 10-year ASCVD risk score Mikey Bussing DC Brooke Bonito., et al., 2013) is: 16.3%   Values used to calculate the score:     Age: 2 years     Sex: Male     Is Non-Hispanic African American: No     Diabetic: No      Tobacco smoker: No     Systolic Blood Pressure: 169 mmHg     Is BP treated: No     HDL Cholesterol: 40 mg/dL     Total Cholesterol: 210 mg/dL           No orders of the defined types were placed in this encounter.  No orders of the defined types were placed in this encounter.   Patient instructions: If interested, check with pharmacy about new 2 shot shingles series (shingrix).  Work on low cholesterol diet. Good to see you today Return as needed or in 1 year for next physical.   Follow up plan: Return in about 1 year (around 01/04/2021) for annual exam, prior fasting for blood work.  Ria Bush, MD

## 2020-01-05 NOTE — Assessment & Plan Note (Signed)
Chronic, not on statin. Reviewed ASCVD risk. Declines statin. The 10-year ASCVD risk score Dustin Cole Dustin Cole., et al., 2013) is: 16.3%   Values used to calculate the score:     Age: 69 years     Sex: Male     Is Non-Hispanic African American: No     Diabetic: No     Tobacco smoker: No     Systolic Blood Pressure: 955 mmHg     Is BP treated: No     HDL Cholesterol: 40 mg/dL     Total Cholesterol: 210 mg/dL

## 2020-05-19 ENCOUNTER — Other Ambulatory Visit: Payer: Self-pay | Admitting: Family Medicine

## 2020-05-21 NOTE — Telephone Encounter (Signed)
Pharmacy requests refill on: Sildenafil 20 mg   LAST REFILL: 11/09/2019 (Q-30, R-2) LAST OV: 01/05/2020 NEXT OV: 01/10/2021 PHARMACY: [id number] [place], [place]

## 2020-08-13 IMAGING — DX DG CHEST 2V
2 series · 2 of 2 positions shown · non-contrast
Comparison: None.

CLINICAL DATA: Polycythemia. Nonsmoker. No chest complaints per
patient.

EXAM:
CHEST - 2 VIEW

[chest pa]
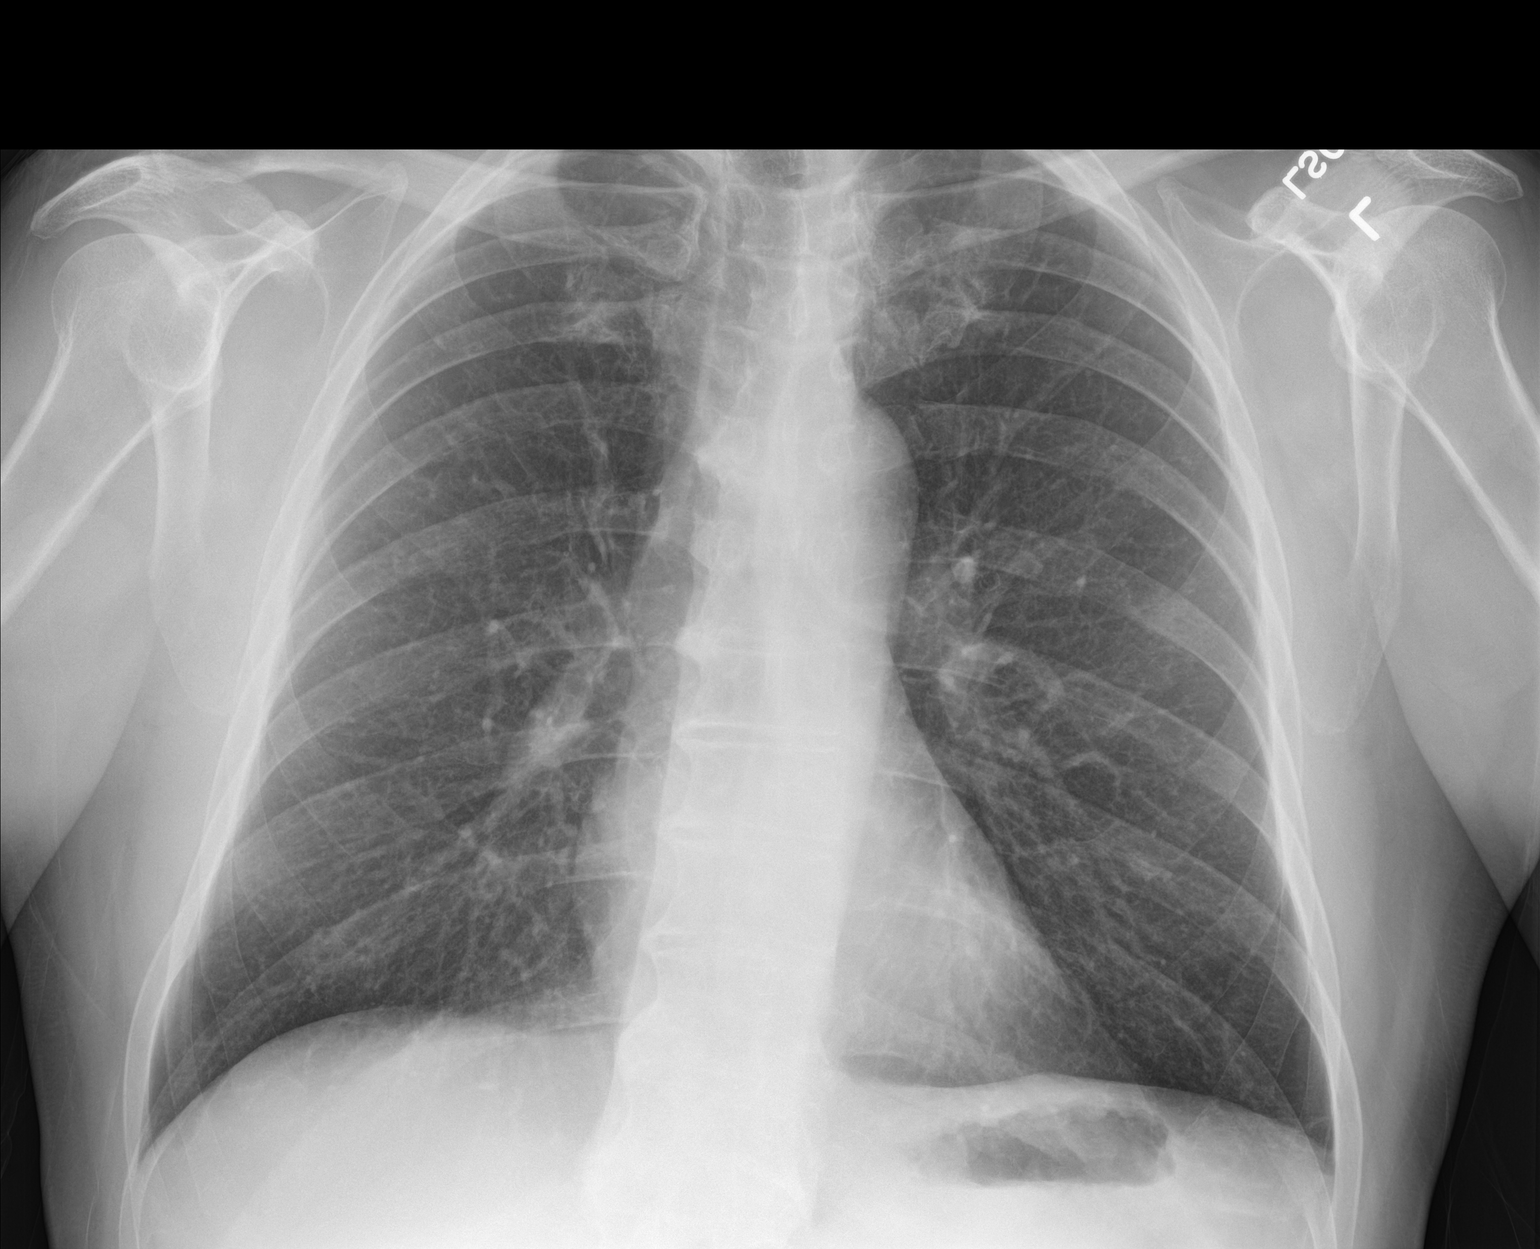

[chest lat]
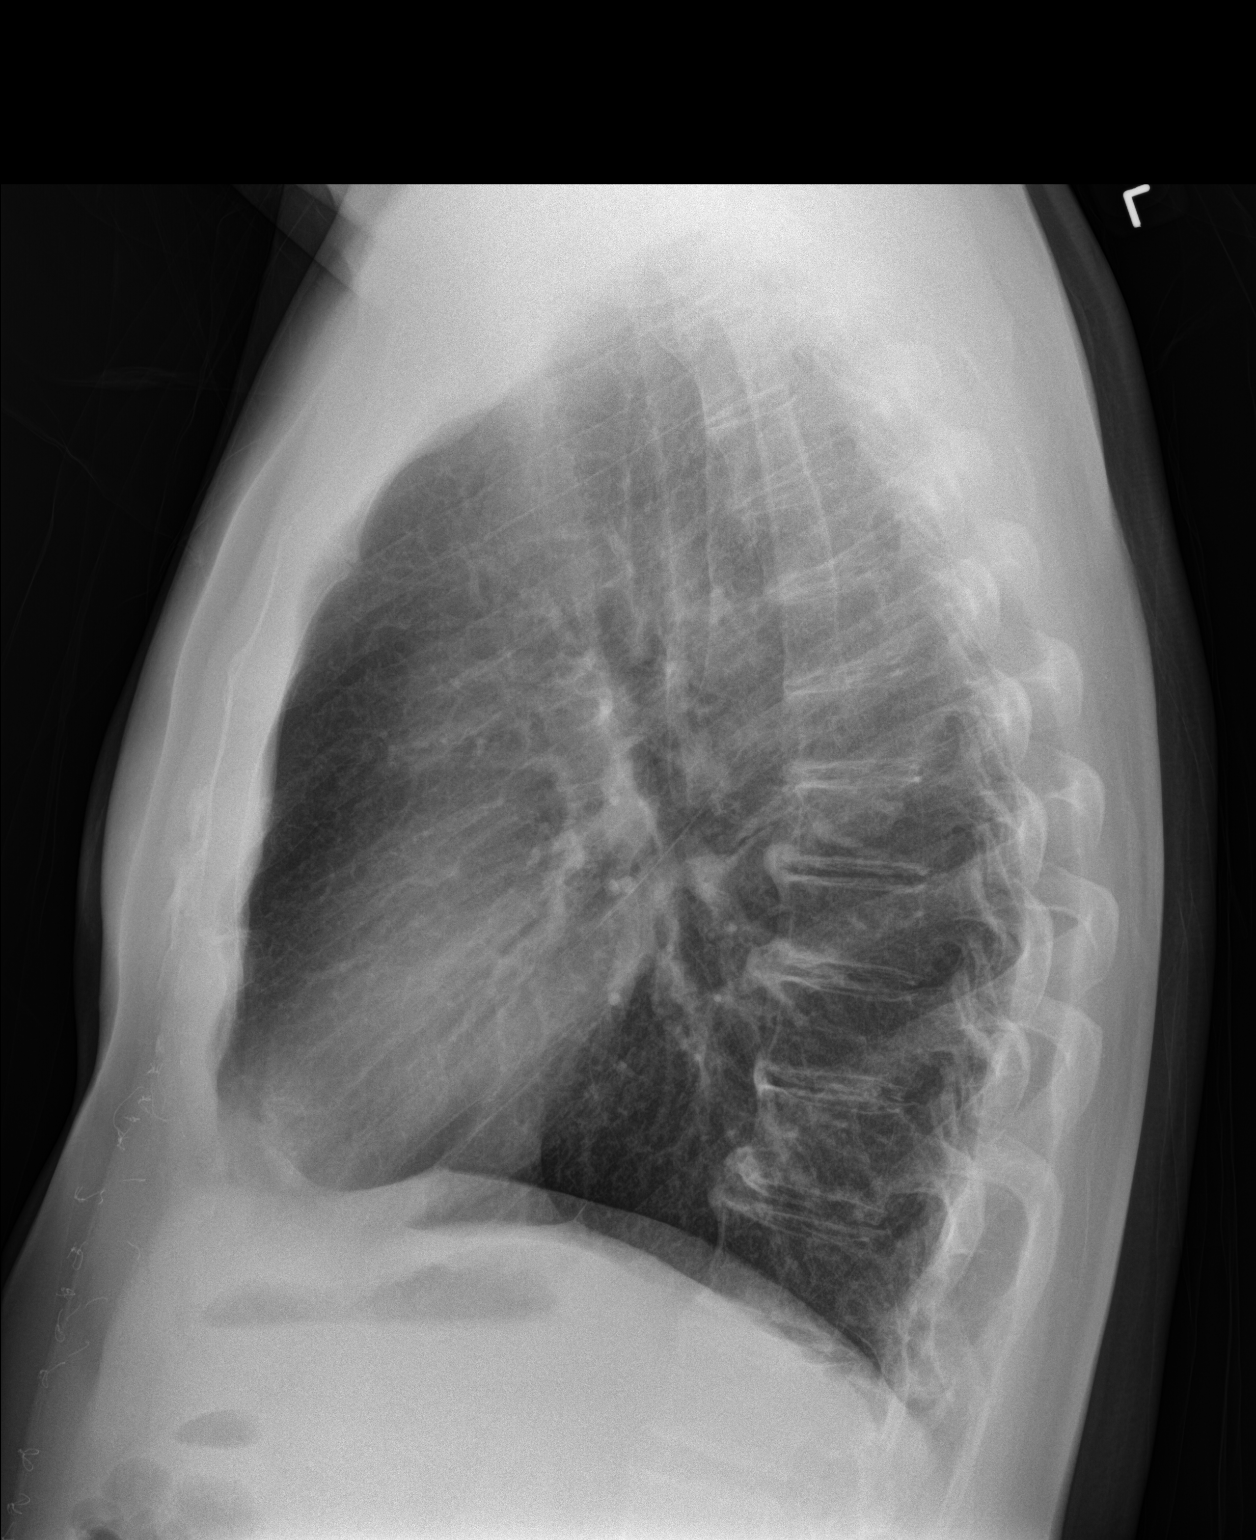

[2 of 2 positions shown; findings below may reference images not displayed]

FINDINGS: Heart, mediastinum and hila are within normal limits.

The lungs are clear.

No pleural effusion or pneumothorax.

Skeletal structures are intact.
IMPRESSION: No active cardiopulmonary disease.

## 2020-09-08 IMAGING — DX DG CHEST 2V
2 series · 2 of 2 positions shown · non-contrast
Comparison: 12/17/2017

CLINICAL DATA: Cough. Right lower lobe rales on exam. Prior
splenectomy.

EXAM:
CHEST - 2 VIEW

[chest pa]
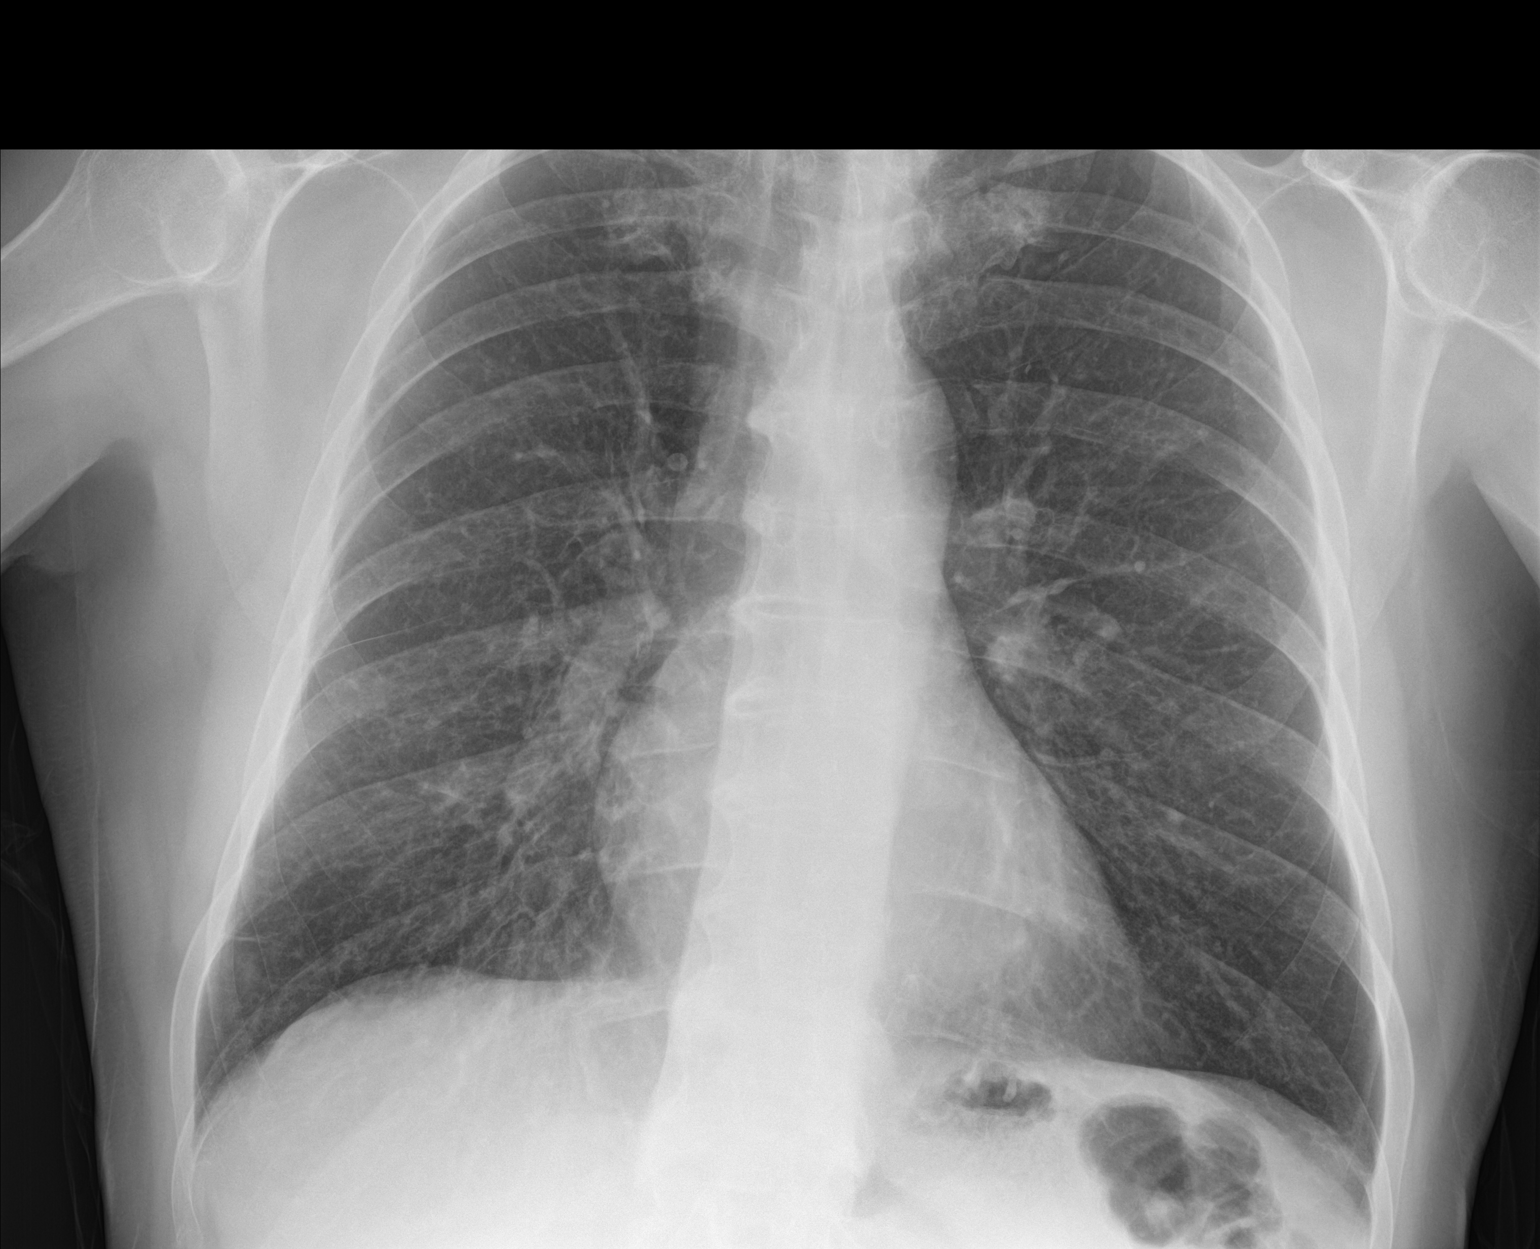

[chest lat]
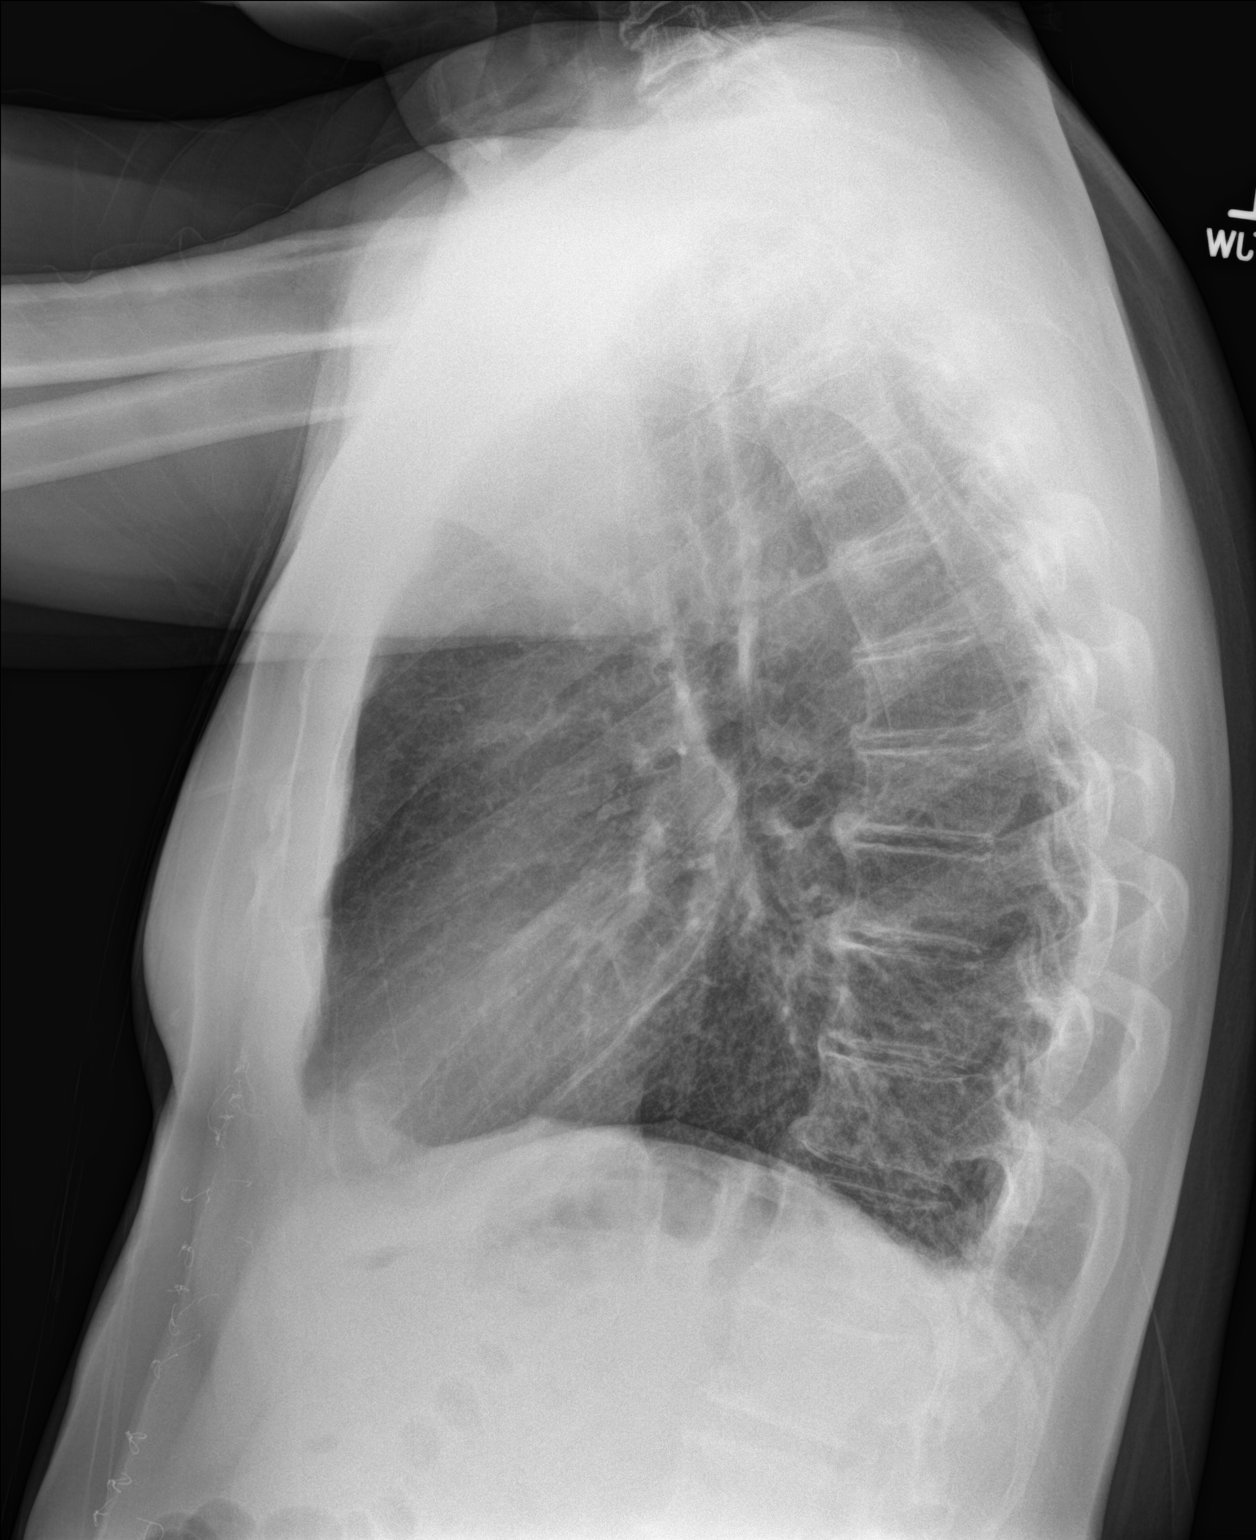

[2 of 2 positions shown; findings below may reference images not displayed]

FINDINGS: The heart size and mediastinal contours are within normal limits.
Both lungs are clear. No evidence of pleural effusion. Thoracic
spine degenerative changes again noted.
IMPRESSION: No active cardiopulmonary disease.

## 2020-09-17 ENCOUNTER — Other Ambulatory Visit: Payer: Self-pay

## 2020-09-17 ENCOUNTER — Ambulatory Visit (INDEPENDENT_AMBULATORY_CARE_PROVIDER_SITE_OTHER): Payer: Medicare Other

## 2020-09-17 DIAGNOSIS — Z111 Encounter for screening for respiratory tuberculosis: Secondary | ICD-10-CM

## 2020-09-17 NOTE — Progress Notes (Signed)
PPD Placement note Dustin Cole, 70 y.o. male is here today for placement of PPD test  Reason for PPD test: Needs done for work  Pt taken PPD test before: yes   Verified in allergy area and with patient that they are not allergic to the products PPD is made of (Phenol or Tween). Yes  Is patient taking any oral or IV steroid medication now or have they taken it in the last month? No  Has the patient ever received the BCG vaccine?: No  Has the patient been in recent contact with anyone known or suspected of having active TB disease?: No       Date of exposure (if applicable): N/A       Name of person they were exposed to (if applicable): N/A   O: Alert and oriented in NAD. P:  PPD placed on 09/17/2020.  Patient advised to return for reading within 48-72 hours.

## 2020-09-19 ENCOUNTER — Other Ambulatory Visit: Payer: Self-pay | Admitting: Family Medicine

## 2020-09-19 LAB — TB SKIN TEST
Induration: 0 mm
TB Skin Test: NEGATIVE

## 2020-12-24 DIAGNOSIS — H25813 Combined forms of age-related cataract, bilateral: Secondary | ICD-10-CM | POA: Diagnosis not present

## 2020-12-28 ENCOUNTER — Other Ambulatory Visit: Payer: Self-pay | Admitting: Family Medicine

## 2020-12-28 DIAGNOSIS — D751 Secondary polycythemia: Secondary | ICD-10-CM

## 2020-12-28 DIAGNOSIS — D580 Hereditary spherocytosis: Secondary | ICD-10-CM

## 2020-12-28 DIAGNOSIS — E785 Hyperlipidemia, unspecified: Secondary | ICD-10-CM

## 2020-12-28 DIAGNOSIS — Z125 Encounter for screening for malignant neoplasm of prostate: Secondary | ICD-10-CM

## 2021-01-03 ENCOUNTER — Encounter: Payer: Medicare Other | Admitting: Family Medicine

## 2021-01-03 ENCOUNTER — Other Ambulatory Visit: Payer: Self-pay

## 2021-01-03 ENCOUNTER — Other Ambulatory Visit: Payer: Medicare Other

## 2021-01-03 ENCOUNTER — Ambulatory Visit (INDEPENDENT_AMBULATORY_CARE_PROVIDER_SITE_OTHER): Payer: Medicare Other | Admitting: Radiology

## 2021-01-03 DIAGNOSIS — Z125 Encounter for screening for malignant neoplasm of prostate: Secondary | ICD-10-CM | POA: Diagnosis not present

## 2021-01-03 DIAGNOSIS — D751 Secondary polycythemia: Secondary | ICD-10-CM

## 2021-01-03 DIAGNOSIS — D580 Hereditary spherocytosis: Secondary | ICD-10-CM

## 2021-01-03 DIAGNOSIS — E785 Hyperlipidemia, unspecified: Secondary | ICD-10-CM

## 2021-01-03 LAB — LIPID PANEL
Cholesterol: 204 mg/dL — ABNORMAL HIGH (ref 0–200)
HDL: 41.9 mg/dL (ref >39.00)
LDL Cholesterol: 144 mg/dL — ABNORMAL HIGH (ref 0–99)
NonHDL: 162.19
Total CHOL/HDL Ratio: 5
Triglycerides: 89 mg/dL (ref 0.0–149.0)
VLDL: 17.8 mg/dL (ref 0.0–40.0)

## 2021-01-03 LAB — CBC WITH DIFFERENTIAL/PLATELET
Absolute Monocytes: 840 cells/uL (ref 200–950)
Basophils Absolute: 72 cells/uL (ref 0–200)
Basophils Relative: 0.9 %
Eosinophils Absolute: 240 cells/uL (ref 15–500)
Eosinophils Relative: 3 %
HCT: 49.4 % (ref 38.5–50.0)
Hemoglobin: 17.6 g/dL — ABNORMAL HIGH (ref 13.2–17.1)
Lymphs Abs: 1808 cells/uL (ref 850–3900)
MCH: 30.9 pg (ref 27.0–33.0)
MCHC: 35.6 g/dL (ref 32.0–36.0)
MCV: 86.8 fL (ref 80.0–100.0)
MPV: 10.5 fL (ref 7.5–12.5)
Monocytes Relative: 10.5 %
Neutro Abs: 5040 cells/uL (ref 1500–7800)
Neutrophils Relative %: 63 %
Platelets: 381 10*3/uL (ref 140–400)
RBC: 5.69 10*6/uL (ref 4.20–5.80)
RDW: 13.1 % (ref 11.0–15.0)
Total Lymphocyte: 22.6 %
WBC: 8 10*3/uL (ref 3.8–10.8)

## 2021-01-03 LAB — COMPREHENSIVE METABOLIC PANEL
ALT: 21 U/L (ref 0–53)
AST: 20 U/L (ref 0–37)
Albumin: 4.3 g/dL (ref 3.5–5.2)
Alkaline Phosphatase: 74 U/L (ref 39–117)
BUN: 13 mg/dL (ref 6–23)
CO2: 32 mEq/L (ref 19–32)
Calcium: 9.1 mg/dL (ref 8.4–10.5)
Chloride: 100 mEq/L (ref 96–112)
Creatinine, Ser: 0.82 mg/dL (ref 0.40–1.50)
GFR: 89.82 mL/min (ref >60.00)
Glucose, Bld: 87 mg/dL (ref 70–99)
Potassium: 4.4 mEq/L (ref 3.5–5.1)
Sodium: 140 mEq/L (ref 135–145)
Total Bilirubin: 1.4 mg/dL — ABNORMAL HIGH (ref 0.2–1.2)
Total Protein: 6.8 g/dL (ref 6.0–8.3)

## 2021-01-03 LAB — PSA, MEDICARE: PSA: 1.97 ng/ml (ref 0.10–4.00)

## 2021-01-03 LAB — TSH: TSH: 1.73 u[IU]/mL (ref 0.35–5.50)

## 2021-01-03 NOTE — Progress Notes (Deleted)
Lab appt

## 2021-01-10 ENCOUNTER — Encounter: Payer: Self-pay | Admitting: Family Medicine

## 2021-01-10 ENCOUNTER — Other Ambulatory Visit: Payer: Self-pay

## 2021-01-10 ENCOUNTER — Ambulatory Visit (INDEPENDENT_AMBULATORY_CARE_PROVIDER_SITE_OTHER): Payer: Medicare Other | Admitting: Family Medicine

## 2021-01-10 VITALS — BP 120/74 | HR 82 | Temp 98.0°F | Ht 67.0 in | Wt 167.3 lb

## 2021-01-10 DIAGNOSIS — Z0000 Encounter for general adult medical examination without abnormal findings: Secondary | ICD-10-CM

## 2021-01-10 DIAGNOSIS — Z9081 Acquired absence of spleen: Secondary | ICD-10-CM | POA: Diagnosis not present

## 2021-01-10 DIAGNOSIS — D751 Secondary polycythemia: Secondary | ICD-10-CM

## 2021-01-10 DIAGNOSIS — E785 Hyperlipidemia, unspecified: Secondary | ICD-10-CM

## 2021-01-10 DIAGNOSIS — Z1211 Encounter for screening for malignant neoplasm of colon: Secondary | ICD-10-CM | POA: Diagnosis not present

## 2021-01-10 DIAGNOSIS — D580 Hereditary spherocytosis: Secondary | ICD-10-CM

## 2021-01-10 NOTE — Assessment & Plan Note (Signed)
Chronic, not on statin. Has previously declined statin.  The 10-year ASCVD risk score (Arnett DK, et al., 2019) is: 16.7%   Values used to calculate the score:     Age: 70 years     Sex: Male     Is Non-Hispanic African American: No     Diabetic: No     Tobacco smoker: No     Systolic Blood Pressure: 728 mmHg     Is BP treated: No     HDL Cholesterol: 41.9 mg/dL     Total Cholesterol: 204 mg/dL

## 2021-01-10 NOTE — Assessment & Plan Note (Signed)
S/p splenectomy.  Consider final pneumovax.  Consider meningitis vaccine - he declines for now.  He will get flu shot later this year.

## 2021-01-10 NOTE — Progress Notes (Signed)
Patient ID: Dustin Cole, male    DOB: [birth year], 70 y.o.   MRN: [id number]  This visit was conducted in person.  BP 120/74   Pulse 82   Temp 98 F (36.7 C) (Temporal)   Ht 5\' 7"  (1.702 m)   Wt 167 lb 5 oz (75.9 kg)   SpO2 96%   BMI 26.20 kg/m    CC: AMW Subjective:   HPI: Dustin Cole is a 70 y.o. male presenting on 01/10/2021 for Medicare Wellness   Did not see health advisor this year.   Hearing Screening   500Hz  1000Hz  2000Hz  4000Hz   Right ear 20 25 40 0  Left ear 25 40 0 0  Vision Screening - Comments:: Last eye exam, 12/2020.  [place] Office Visit from 01/10/2021 in [place] at [facility]  PHQ-2 Total Score 0       Fall Risk  01/10/2021 12/23/2018 12/10/2017 12/04/2016 05/18/2016  Falls in the past year? 0 0 No No No  Follow up - Falls evaluation completed;Falls prevention discussed - - -     Preventative: Colonoscopy ~10 yrs ago with [facility]. Cologuard negative 2019.  Prostate cancer screening - yearly.  Lung cancer screening - not eligible  Flu shot yearly  COVID vaccine - discussed, declines  Pneumovax 12/2013. Prevnar-13 2018. Consider pneumovax Q5 yrs due to h/o splenectomy.  Meningitis vaccine - due, declines Tdap 12/2013  Zostavax with [facility] 12/2012  Shingrix - discussed  Advanced directive: scanned in chart 12/2017. Wife Osbaldo Mark then niece Traycen Goyer are HCPOA. Does not want life prolonging measures if terminally ill.  Seat belt use discussed  Sunscreen use discussed, denies changing moles.  Sleep - averages 7-8 hours/night Non smoker  Alcohol - none  Dentist Q6 mo Eye exam - yearly - monitoring cataracts  Bowel - no constipation/diarrhea Bladder - no incontinence   Caffeine: 3 coffee daily Lives with wife, dogs  Occupation: tile  Activity: active labor, active around the house, cuts wood, exercise bike and walking Diet: good water, fruit/svegetables daily, some chicken and fish      Relevant past medical,  surgical, family and social history reviewed and updated as indicated. Interim medical history since our last visit reviewed. Allergies and medications reviewed and updated. Outpatient Medications Prior to Visit  Medication Sig Dispense Refill   aspirin 81 MG tablet Take 81 mg by mouth daily.     Multiple Vitamin (MULTIVITAMIN) tablet Take 1 tablet by mouth daily.     Multiple Vitamins-Minerals (PRESERVISION AREDS PO) Take by mouth daily.     sildenafil (REVATIO) 20 MG tablet TAKE 2 TO 5 TABLETS BY MOUTH ONCE DAILY AS NEEDED FOR  RELATIONS 30 tablet 3   No facility-administered medications prior to visit.     Per HPI unless specifically indicated in ROS section below Review of Systems  Objective:  BP 120/74   Pulse 82   Temp 98 F (36.7 C) (Temporal)   Ht 5\' 7"  (1.702 m)   Wt 167 lb 5 oz (75.9 kg)   SpO2 96%   BMI 26.20 kg/m   Wt Readings from Last 3 Encounters:  01/10/21 167 lb 5 oz (75.9 kg)  01/05/20 168 lb 4 oz (76.3 kg)  12/30/18 170 lb 7 oz (77.3 kg)      Physical Exam Vitals and nursing note reviewed.  Constitutional:      General: He is not in acute distress.    Appearance: Normal appearance. He is well-developed. He  is not ill-appearing.  HENT:     Head: Normocephalic and atraumatic.     Right Ear: Hearing, tympanic membrane, ear canal and external ear normal.     Left Ear: Hearing, tympanic membrane, ear canal and external ear normal.  Eyes:     General: No scleral icterus.    Extraocular Movements: Extraocular movements intact.     Conjunctiva/sclera: Conjunctivae normal.     Pupils: Pupils are equal, round, and reactive to light.  Neck:     Thyroid: No thyroid mass or thyromegaly.     Vascular: No carotid bruit.  Cardiovascular:     Rate and Rhythm: Normal rate and regular rhythm.     Pulses: Normal pulses.          Radial pulses are 2+ on the right side and 2+ on the left side.     Heart sounds: Normal heart sounds. No murmur heard. Pulmonary:      Effort: Pulmonary effort is normal. No respiratory distress.     Breath sounds: Normal breath sounds. No wheezing, rhonchi or rales.  Abdominal:     General: Bowel sounds are normal. There is no distension.     Palpations: Abdomen is soft. There is no mass.     Tenderness: There is no abdominal tenderness. There is no guarding or rebound.     Hernia: No hernia is present.  Musculoskeletal:        General: Normal range of motion.     Cervical back: Normal range of motion and neck supple.     Right lower leg: No edema.     Left lower leg: No edema.  Lymphadenopathy:     Cervical: No cervical adenopathy.  Skin:    General: Skin is warm and dry.     Findings: No rash.  Neurological:     General: No focal deficit present.     Mental Status: He is alert and oriented to person, place, and time.     Comments:  Recall 3/3 Calculation 5/5 DLROW  Psychiatric:        Mood and Affect: Mood normal.        Behavior: Behavior normal.        Thought Content: Thought content normal.        Judgment: Judgment normal.      Results for orders placed or performed in visit on 01/03/21  CBC with Differential  Result Value Ref Range   WBC 8.0 3.8 - 10.8 Thousand/uL   RBC 5.69 4.20 - 5.80 Million/uL   Hemoglobin 17.6 (H) 13.2 - 17.1 g/dL   HCT 49.4 38.5 - 50.0 %   MCV 86.8 80.0 - 100.0 fL   MCH 30.9 27.0 - 33.0 pg   MCHC 35.6 32.0 - 36.0 g/dL   RDW 13.1 11.0 - 15.0 %   Platelets 381 140 - 400 Thousand/uL   MPV 10.5 7.5 - 12.5 fL   Neutro Abs 5,040 1,500 - 7,800 cells/uL   Lymphs Abs 1,808 850 - 3,900 cells/uL   Absolute Monocytes 840 200 - 950 cells/uL   Eosinophils Absolute 240 15 - 500 cells/uL   Basophils Absolute 72 0 - 200 cells/uL   Neutrophils Relative % 63 %   Total Lymphocyte 22.6 %   Monocytes Relative 10.5 %   Eosinophils Relative 3.0 %   Basophils Relative 0.9 %    Assessment & Plan:  This visit occurred during the SARS-CoV-2 public health emergency.  Safety protocols were  in place, including screening  questions prior to the visit, additional usage of staff PPE, and extensive cleaning of exam room while observing appropriate contact time as indicated for disinfecting solutions.   Problem List Items Addressed This Visit     Medicare annual wellness visit, subsequent - Primary (Chronic)    I have personally reviewed the Medicare Annual Wellness questionnaire and have noted 1. The patient's medical and social history 2. Their use of alcohol, tobacco or illicit drugs 3. Their current medications and supplements 4. The patient's functional ability including ADL's, fall risks, home safety risks and hearing or visual impairment. Cognitive function has been assessed and addressed as indicated.  5. Diet and physical activity 6. Evidence for depression or mood disorders The patients weight, height, BMI have been recorded in the chart. I have made referrals, counseling and provided education to the patient based on review of the above and I have provided the pt with a written personalized care plan for preventive services. Provider list updated.. See scanned questionairre as needed for further documentation. Reviewed preventative protocols and updated unless pt declined.       Hereditary spherocytosis ([place])    S/p splenectomy.  Consider final pneumovax.  Consider meningitis vaccine - he declines for now.  He will get flu shot later this year.       Asplenia after surgical procedure   Polycythemia    Mild, continue to monitor.  Denies OSA symptoms or smoking history       Dyslipidemia    Chronic, not on statin. Has previously declined statin.  The 10-year ASCVD risk score (Arnett DK, et al., 2019) is: 16.7%   Values used to calculate the score:     Age: 59 years     Sex: Male     Is Non-Hispanic African American: No     Diabetic: No     Tobacco smoker: No     Systolic Blood Pressure: 544 mmHg     Is BP treated: No     HDL Cholesterol: 41.9 mg/dL      Total Cholesterol: 204 mg/dL       Other Visit Diagnoses     Special screening for malignant neoplasms, colon       Relevant Orders   Cologuard        No orders of the defined types were placed in this encounter.  Orders Placed This Encounter  Procedures   Cologuard    Patient instructions: We will sign you up for cologuard.  If interested, check with pharmacy about new 2 shot shingles series (shingrix).  Also consider getting meningitis shot - recommended every 5 years when spleen is removed.  You are doing well today  Return in 1 year for next wellness visit.   Follow up plan: Return in about 1 year (around 01/10/2022) for medicare wellness visit.  Ria Bush, MD

## 2021-01-10 NOTE — Patient Instructions (Addendum)
We will sign you up for cologuard.  If interested, check with pharmacy about new 2 shot shingles series (shingrix).  Also consider getting meningitis shot - recommended every 5 years when spleen is removed.  You are doing well today  Return in 1 year for next wellness visit.   Health Maintenance After Age 69 After age 63, you are at a higher risk for certain long-term diseases and infections as well as injuries from falls. Falls are a major cause of broken bones and head injuries in people who are older than age 20. Getting regular preventive care can help to keep you healthy and well. Preventive care includes getting regular testing and making lifestyle changes as recommended by your health care provider. Talk with your health care provider about: Which screenings and tests you should have. A screening is a test that checks for a disease when you have no symptoms. A diet and exercise plan that is right for you. What should I know about screenings and tests to prevent falls? Screening and testing are the best ways to find a health problem early. Early diagnosis and treatment give you the best chance of managing medical conditions that are common after age 70. Certain conditions and lifestyle choices may make you more likely to have a fall. Your health care provider may recommend: Regular vision checks. Poor vision and conditions such as cataracts can make you more likely to have a fall. If you wear glasses, make sure to get your prescription updated if your vision changes. Medicine review. Work with your health care provider to regularly review all of the medicines you are taking, including over-the-counter medicines. Ask your health care provider about any side effects that may make you more likely to have a fall. Tell your health care provider if any medicines that you take make you feel dizzy or sleepy. Osteoporosis screening. Osteoporosis is a condition that causes the bones to get weaker. This can  make the bones weak and cause them to break more easily. Blood pressure screening. Blood pressure changes and medicines to control blood pressure can make you feel dizzy. Strength and balance checks. Your health care provider may recommend certain tests to check your strength and balance while standing, walking, or changing positions. Foot health exam. Foot pain and numbness, as well as not wearing proper footwear, can make you more likely to have a fall. Depression screening. You may be more likely to have a fall if you have a fear of falling, feel emotionally low, or feel unable to do activities that you used to do. Alcohol use screening. Using too much alcohol can affect your balance and may make you more likely to have a fall. What actions can I take to lower my risk of falls? General instructions Talk with your health care provider about your risks for falling. Tell your health care provider if: You fall. Be sure to tell your health care provider about all falls, even ones that seem minor. You feel dizzy, sleepy, or off-balance. Take over-the-counter and prescription medicines only as told by your health care provider. These include any supplements. Eat a healthy diet and maintain a healthy weight. A healthy diet includes low-fat dairy products, low-fat (lean) meats, and fiber from whole grains, beans, and lots of fruits and vegetables. Home safety Remove any tripping hazards, such as rugs, cords, and clutter. Install safety equipment such as grab bars in bathrooms and safety rails on stairs. Keep rooms and walkways well-lit. Activity  Follow a  regular exercise program to stay fit. This will help you maintain your balance. Ask your health care provider what types of exercise are appropriate for you. If you need a cane or walker, use it as recommended by your health care provider. Wear supportive shoes that have nonskid soles. Lifestyle Do not drink alcohol if your health care provider  tells you not to drink. If you drink alcohol, limit how much you have: 0-1 drink a day for women. 0-2 drinks a day for men. Be aware of how much alcohol is in your drink. In the [place] one drink equals one typical bottle of beer (12 oz), one-half glass of wine (5 oz), or one shot of hard liquor (1 oz). Do not use any products that contain nicotine or tobacco, such as cigarettes and e-cigarettes. If you need help quitting, ask your health care provider. Summary Having a healthy lifestyle and getting preventive care can help to protect your health and wellness after age 28. Screening and testing are the best way to find a health problem early and help you avoid having a fall. Early diagnosis and treatment give you the best chance for managing medical conditions that are more common for people who are older than age 71. Falls are a major cause of broken bones and head injuries in people who are older than age 86. Take precautions to prevent a fall at home. Work with your health care provider to learn what changes you can make to improve your health and wellness and to prevent falls. This information is not intended to replace advice given to you by your health care provider. Make sure you discuss any questions you have with your health care provider. Document Revised: 06/07/2020 Document Reviewed: 03/15/2020 Elsevier Patient Education  2022 [employer].

## 2021-01-10 NOTE — Assessment & Plan Note (Addendum)
Mild, continue to monitor.  Denies OSA symptoms or smoking history

## 2021-01-10 NOTE — Assessment & Plan Note (Signed)

## 2021-01-17 DIAGNOSIS — Z1211 Encounter for screening for malignant neoplasm of colon: Secondary | ICD-10-CM | POA: Diagnosis not present

## 2021-01-24 ENCOUNTER — Other Ambulatory Visit: Payer: Self-pay | Admitting: Family Medicine

## 2021-01-24 DIAGNOSIS — R195 Other fecal abnormalities: Secondary | ICD-10-CM

## 2021-01-24 LAB — COLOGUARD: Cologuard: POSITIVE — AB

## 2021-02-28 DIAGNOSIS — R195 Other fecal abnormalities: Secondary | ICD-10-CM | POA: Diagnosis not present

## 2021-03-18 DIAGNOSIS — Z1211 Encounter for screening for malignant neoplasm of colon: Secondary | ICD-10-CM | POA: Diagnosis not present

## 2021-03-18 DIAGNOSIS — R195 Other fecal abnormalities: Secondary | ICD-10-CM | POA: Diagnosis not present

## 2021-03-18 DIAGNOSIS — K5730 Diverticulosis of large intestine without perforation or abscess without bleeding: Secondary | ICD-10-CM | POA: Diagnosis not present

## 2021-03-18 DIAGNOSIS — D128 Benign neoplasm of rectum: Secondary | ICD-10-CM | POA: Diagnosis not present

## 2021-03-21 DIAGNOSIS — D128 Benign neoplasm of rectum: Secondary | ICD-10-CM | POA: Diagnosis not present

## 2021-05-22 ENCOUNTER — Other Ambulatory Visit: Payer: Self-pay | Admitting: Family Medicine

## 2021-06-11 HISTORY — PX: COLONOSCOPY: SHX174

## 2021-06-24 DIAGNOSIS — D123 Benign neoplasm of transverse colon: Secondary | ICD-10-CM | POA: Diagnosis not present

## 2021-06-24 DIAGNOSIS — D122 Benign neoplasm of ascending colon: Secondary | ICD-10-CM | POA: Diagnosis not present

## 2021-06-24 DIAGNOSIS — D128 Benign neoplasm of rectum: Secondary | ICD-10-CM | POA: Diagnosis not present

## 2021-06-24 DIAGNOSIS — Z1211 Encounter for screening for malignant neoplasm of colon: Secondary | ICD-10-CM | POA: Diagnosis not present

## 2021-06-24 DIAGNOSIS — K5730 Diverticulosis of large intestine without perforation or abscess without bleeding: Secondary | ICD-10-CM | POA: Diagnosis not present

## 2021-06-24 LAB — HM COLONOSCOPY

## 2021-06-26 DIAGNOSIS — D123 Benign neoplasm of transverse colon: Secondary | ICD-10-CM | POA: Diagnosis not present

## 2021-06-26 DIAGNOSIS — D128 Benign neoplasm of rectum: Secondary | ICD-10-CM | POA: Diagnosis not present

## 2021-06-26 DIAGNOSIS — D122 Benign neoplasm of ascending colon: Secondary | ICD-10-CM | POA: Diagnosis not present

## 2021-07-01 ENCOUNTER — Encounter: Payer: Self-pay | Admitting: Family Medicine

## 2021-07-02 ENCOUNTER — Encounter: Payer: Self-pay | Admitting: Family Medicine

## 2021-08-25 ENCOUNTER — Other Ambulatory Visit: Payer: Self-pay | Admitting: Family Medicine

## 2021-12-25 DIAGNOSIS — H25813 Combined forms of age-related cataract, bilateral: Secondary | ICD-10-CM | POA: Diagnosis not present

## 2021-12-25 DIAGNOSIS — H353131 Nonexudative age-related macular degeneration, bilateral, early dry stage: Secondary | ICD-10-CM | POA: Diagnosis not present

## 2021-12-29 ENCOUNTER — Encounter: Payer: Self-pay | Admitting: Family Medicine

## 2021-12-29 ENCOUNTER — Ambulatory Visit (INDEPENDENT_AMBULATORY_CARE_PROVIDER_SITE_OTHER): Payer: Medicare Other | Admitting: Family Medicine

## 2021-12-29 VITALS — BP 122/72 | HR 73 | Temp 97.7°F | Ht 66.75 in | Wt 167.2 lb

## 2021-12-29 DIAGNOSIS — Z125 Encounter for screening for malignant neoplasm of prostate: Secondary | ICD-10-CM

## 2021-12-29 DIAGNOSIS — D580 Hereditary spherocytosis: Secondary | ICD-10-CM | POA: Diagnosis not present

## 2021-12-29 DIAGNOSIS — E785 Hyperlipidemia, unspecified: Secondary | ICD-10-CM

## 2021-12-29 DIAGNOSIS — Z0000 Encounter for general adult medical examination without abnormal findings: Secondary | ICD-10-CM

## 2021-12-29 LAB — LIPID PANEL
Cholesterol: 183 mg/dL (ref 0–200)
HDL: 42.4 mg/dL (ref >39.00)
LDL Cholesterol: 120 mg/dL — ABNORMAL HIGH (ref 0–99)
NonHDL: 140.72
Total CHOL/HDL Ratio: 4
Triglycerides: 105 mg/dL (ref 0.0–149.0)
VLDL: 21 mg/dL (ref 0.0–40.0)

## 2021-12-29 LAB — COMPREHENSIVE METABOLIC PANEL
ALT: 19 U/L (ref 0–53)
AST: 19 U/L (ref 0–37)
Albumin: 4 g/dL (ref 3.5–5.2)
Alkaline Phosphatase: 76 U/L (ref 39–117)
BUN: 10 mg/dL (ref 6–23)
CO2: 29 mEq/L (ref 19–32)
Calcium: 8.7 mg/dL (ref 8.4–10.5)
Chloride: 101 mEq/L (ref 96–112)
Creatinine, Ser: 0.84 mg/dL (ref 0.40–1.50)
GFR: 88.55 mL/min (ref >60.00)
Glucose, Bld: 72 mg/dL (ref 70–99)
Potassium: 3.9 mEq/L (ref 3.5–5.1)
Sodium: 140 mEq/L (ref 135–145)
Total Bilirubin: 1.4 mg/dL — ABNORMAL HIGH (ref 0.2–1.2)
Total Protein: 6.5 g/dL (ref 6.0–8.3)

## 2021-12-29 LAB — CBC WITH DIFFERENTIAL/PLATELET
Basophils Absolute: 0.1 10*3/uL (ref 0.0–0.1)
Basophils Relative: 1 % (ref 0.0–3.0)
Eosinophils Absolute: 0.2 10*3/uL (ref 0.0–0.7)
Eosinophils Relative: 2.1 % (ref 0.0–5.0)
HCT: 45.4 % (ref 39.0–52.0)
Hemoglobin: 16.3 g/dL (ref 13.0–17.0)
Lymphocytes Relative: 19.5 % (ref 12.0–46.0)
Lymphs Abs: 1.6 10*3/uL (ref 0.7–4.0)
MCHC: 35.9 g/dL (ref 30.0–36.0)
MCV: 85.2 fl (ref 78.0–100.0)
Monocytes Absolute: 0.8 10*3/uL (ref 0.1–1.0)
Monocytes Relative: 9.7 % (ref 3.0–12.0)
Neutro Abs: 5.7 10*3/uL (ref 1.4–7.7)
Neutrophils Relative %: 67.7 % (ref 43.0–77.0)
Platelets: 319 10*3/uL (ref 150.0–400.0)
RBC: 5.33 Mil/uL (ref 4.22–5.81)
RDW: 14.9 % (ref 11.5–15.5)
WBC: 8.4 10*3/uL (ref 4.0–10.5)

## 2021-12-29 LAB — PSA, MEDICARE: PSA: 2.24 ng/ml (ref 0.10–4.00)

## 2021-12-29 NOTE — Patient Instructions (Addendum)
If interested, check with pharmacy about new 2 shot shingles series (shingrix).  Consider pneumonia shot and meningitis shot in history of spleen removal.  Labs today.  Good to see you today Return as needed or in 1 year for next physical.   Health Maintenance After Age 70 After age 39, you are at a higher risk for certain long-term diseases and infections as well as injuries from falls. Falls are a major cause of broken bones and head injuries in people who are older than age 33. Getting regular preventive care can help to keep you healthy and well. Preventive care includes getting regular testing and making lifestyle changes as recommended by your health care provider. Talk with your health care provider about: Which screenings and tests you should have. A screening is a test that checks for a disease when you have no symptoms. A diet and exercise plan that is right for you. What should I know about screenings and tests to prevent falls? Screening and testing are the best ways to find a health problem early. Early diagnosis and treatment give you the best chance of managing medical conditions that are common after age 56. Certain conditions and lifestyle choices may make you more likely to have a fall. Your health care provider may recommend: Regular vision checks. Poor vision and conditions such as cataracts can make you more likely to have a fall. If you wear glasses, make sure to get your prescription updated if your vision changes. Medicine review. Work with your health care provider to regularly review all of the medicines you are taking, including over-the-counter medicines. Ask your health care provider about any side effects that may make you more likely to have a fall. Tell your health care provider if any medicines that you take make you feel dizzy or sleepy. Strength and balance checks. Your health care provider may recommend certain tests to check your strength and balance while standing,  walking, or changing positions. Foot health exam. Foot pain and numbness, as well as not wearing proper footwear, can make you more likely to have a fall. Screenings, including: Osteoporosis screening. Osteoporosis is a condition that causes the bones to get weaker and break more easily. Blood pressure screening. Blood pressure changes and medicines to control blood pressure can make you feel dizzy. Depression screening. You may be more likely to have a fall if you have a fear of falling, feel depressed, or feel unable to do activities that you used to do. Alcohol use screening. Using too much alcohol can affect your balance and may make you more likely to have a fall. Follow these instructions at home: Lifestyle Do not drink alcohol if: Your health care provider tells you not to drink. If you drink alcohol: Limit how much you have to: 0-1 drink a day for women. 0-2 drinks a day for men. Know how much alcohol is in your drink. In the [place] one drink equals one 12 oz bottle of beer (355 mL), one 5 oz glass of wine (148 mL), or one 1 oz glass of hard liquor (44 mL). Do not use any products that contain nicotine or tobacco. These products include cigarettes, chewing tobacco, and vaping devices, such as e-cigarettes. If you need help quitting, ask your health care provider. Activity  Follow a regular exercise program to stay fit. This will help you maintain your balance. Ask your health care provider what types of exercise are appropriate for you. If you need a cane or walker, use  it as recommended by your health care provider. Wear supportive shoes that have nonskid soles. Safety  Remove any tripping hazards, such as rugs, cords, and clutter. Install safety equipment such as grab bars in bathrooms and safety rails on stairs. Keep rooms and walkways well-lit. General instructions Talk with your health care provider about your risks for falling. Tell your health care provider if: You fall.  Be sure to tell your health care provider about all falls, even ones that seem minor. You feel dizzy, tiredness (fatigue), or off-balance. Take over-the-counter and prescription medicines only as told by your health care provider. These include supplements. Eat a healthy diet and maintain a healthy weight. A healthy diet includes low-fat dairy products, low-fat (lean) meats, and fiber from whole grains, beans, and lots of fruits and vegetables. Stay current with your vaccines. Schedule regular health, dental, and eye exams. Summary Having a healthy lifestyle and getting preventive care can help to protect your health and wellness after age 24. Screening and testing are the best way to find a health problem early and help you avoid having a fall. Early diagnosis and treatment give you the best chance for managing medical conditions that are more common for people who are older than age 53. Falls are a major cause of broken bones and head injuries in people who are older than age 70. Take precautions to prevent a fall at home. Work with your health care provider to learn what changes you can make to improve your health and wellness and to prevent falls. This information is not intended to replace advice given to you by your health care provider. Make sure you discuss any questions you have with your health care provider. Document Revised: 08/19/2020 Document Reviewed: 08/19/2020 Elsevier Patient Education  [employer].

## 2021-12-29 NOTE — Progress Notes (Signed)
Patient ID: Dustin Cole, male    DOB: Aug 23, 1950, 71 y.o.   MRN: [id number]  This visit was conducted in person.  BP 122/72   Pulse 73   Temp 97.7 F (36.5 C) (Temporal)   Ht 5' 6.75" (1.695 m)   Wt 167 lb 4 oz (75.9 kg)   SpO2 95%   BMI 26.39 kg/m    CC: AMW - too early for wellness visit - changed to no charge visit. I asked him to return 11/2022 for AMW.  Subjective:   HPI: Dustin Cole is a 71 y.o. male presenting on 12/29/2021 for Follow-up   Did not see health advisor this year   No results found.  [place] Office Visit from 01/10/2021 in [place] at [facility] Total Score 0     No trouble with hearing.  No tinnitus.  Denies depressed mood.  No falls in the past year No hospitalizations or ER visits.     01/10/2021    7:57 AM 12/23/2018    9:48 AM 12/10/2017    9:30 AM 12/04/2016   10:19 AM 05/18/2016    8:54 AM  Fall Risk   Falls in the past year? 0 0 No No No  Follow up  Falls evaluation completed;Falls prevention discussed      Activities of Daily Living:     Bathing- independent    Dressing- independent    Eating- independent    Toileting- independent    Transferring- independent    Continence- independent Overall Assessment: independent  Instrumental Activities of Daily Living:     Transportation- independent    Meal/Food Preparation-independent    Shopping Errands- independent    Housekeeping/Chores- independent     Money Management/Finances- independent     Medication Management- independent    Ability to Use Telephone- independent    Laundry- independent  Overall Assessment:  independent  Care Team:  GI - Dr Acie Fredrickson PCP - Danise Mina  Preventative: COLONOSCOPY 06/2021 - fair colon prep, TA, SSPx2, diverticulosis, rpt 3 yrs with 2d prep [name] Dr Therisa Doyne)  Prostate cancer screening - yearly.  Lung cancer screening - not eligible  Flu shot yearly  COVID vaccine - discussed, declines  Pneumovax 12/2013.  Prevnar-13 2018. Consider pneumovax Q5 yrs due to h/o splenectomy.  Meningitis vaccine - due, declines - wants to wait til next year.  Tdap 12/2013  Zostavax with [name] 12/2012  Shingrix - discussed  Advanced directive: scanned in chart 12/2017. Wife Yaw Escoto then niece Hiroshi Krummel are HCPOA. Does not want life prolonging measures if terminally ill.  Seat belt use discussed  Sunscreen use discussed, denies changing moles.  Sleep - averages 7-8 hours/night Non smoker  Alcohol - none  Dentist Q6 mo  Eye exam - yearly - monitoring cataracts  Bowel - no constipation Bladder - no incontinence   Caffeine: 3 coffee daily Lives with wife, dogs  Occupation: tile  Activity: active labor, active around the house, cuts wood, exercise bike and walking Diet: good water, fruit/svegetables daily, some chicken and fish      Relevant past medical, surgical, family and social history reviewed and updated as indicated. Interim medical history since our last visit reviewed. Allergies and medications reviewed and updated. Outpatient Medications Prior to Visit  Medication Sig Dispense Refill   aspirin 81 MG tablet Take 81 mg by mouth daily.     Multiple Vitamin (MULTIVITAMIN) tablet Take 1 tablet by mouth daily.  Multiple Vitamins-Minerals (PRESERVISION AREDS PO) Take by mouth daily.     sildenafil (REVATIO) 20 MG tablet TAKE TWO TO FIVE TABLETS BY MOUTH ONCE DAILY AS NEEDED FOR RELATIONS 30 tablet 2   No facility-administered medications prior to visit.     Per HPI unless specifically indicated in ROS section below Review of Systems  Objective:  BP 122/72   Pulse 73   Temp 97.7 F (36.5 C) (Temporal)   Ht 5' 6.75" (1.695 m)   Wt 167 lb 4 oz (75.9 kg)   SpO2 95%   BMI 26.39 kg/m   Wt Readings from Last 3 Encounters:  12/29/21 167 lb 4 oz (75.9 kg)  01/10/21 167 lb 5 oz (75.9 kg)  01/05/20 168 lb 4 oz (76.3 kg)      Physical Exam Vitals and nursing note reviewed.   Constitutional:      General: He is not in acute distress.    Appearance: Normal appearance. He is well-developed. He is not ill-appearing.     Comments: Appears younger than stated age  HENT:     Head: Normocephalic and atraumatic.     Right Ear: Hearing, tympanic membrane, ear canal and external ear normal.     Left Ear: Hearing, tympanic membrane, ear canal and external ear normal.  Eyes:     General: No scleral icterus.    Extraocular Movements: Extraocular movements intact.     Conjunctiva/sclera: Conjunctivae normal.     Pupils: Pupils are equal, round, and reactive to light.  Neck:     Thyroid: No thyroid mass or thyromegaly.  Cardiovascular:     Rate and Rhythm: Normal rate and regular rhythm.     Pulses: Normal pulses.          Radial pulses are 2+ on the right side and 2+ on the left side.     Heart sounds: Normal heart sounds. No murmur heard. Pulmonary:     Effort: Pulmonary effort is normal. No respiratory distress.     Breath sounds: Normal breath sounds. No wheezing, rhonchi or rales.  Abdominal:     General: Bowel sounds are normal. There is no distension.     Palpations: Abdomen is soft. There is no mass.     Tenderness: There is no abdominal tenderness. There is no guarding or rebound.     Hernia: No hernia is present.  Musculoskeletal:        General: Normal range of motion.     Cervical back: Normal range of motion and neck supple.     Right lower leg: No edema.     Left lower leg: No edema.  Lymphadenopathy:     Cervical: No cervical adenopathy.  Skin:    General: Skin is warm and dry.     Findings: No rash.  Neurological:     General: No focal deficit present.     Mental Status: He is alert and oriented to person, place, and time.     Comments:  Recall 3/3 Calculation 5/5   Psychiatric:        Mood and Affect: Mood normal.        Behavior: Behavior normal.        Thought Content: Thought content normal.        Judgment: Judgment normal.        Results for orders placed or performed in visit on 07/02/21  HM COLONOSCOPY  Result Value Ref Range   HM Colonoscopy See Report (in chart) See Report (in  chart), Patient Reported    Assessment & Plan:   Problem List Items Addressed This Visit     Medicare annual wellness visit, subsequent - Primary (Chronic)    I have personally reviewed the Medicare Annual Wellness questionnaire and have noted 1. The patient's medical and social history 2. Their use of alcohol, tobacco or illicit drugs 3. Their current medications and supplements 4. The patient's functional ability including ADL's, fall risks, home safety risks and hearing or visual impairment. Cognitive function has been assessed and addressed as indicated.  5. Diet and physical activity 6. Evidence for depression or mood disorders The patients weight, height, BMI have been recorded in the chart. I have made referrals, counseling and provided education to the patient based on review of the above and I have provided the pt with a written personalized care plan for preventive services. Provider list updated.. See scanned questionairre as needed for further documentation. Reviewed preventative protocols and updated unless pt declined.       Hereditary spherocytosis ([place])   Relevant Orders   CBC with Differential/Platelet   Dyslipidemia   Relevant Orders   Lipid panel   Comprehensive metabolic panel   Other Visit Diagnoses     Special screening for malignant neoplasm of prostate       Relevant Orders   PSA, Medicare        No orders of the defined types were placed in this encounter.  Orders Placed This Encounter  Procedures   Lipid panel   Comprehensive metabolic panel   CBC with Differential/Platelet   PSA, Medicare     Patient instructions: If interested, check with pharmacy about new 2 shot shingles series (shingrix).  Consider pneumonia shot and meningitis shot in history of spleen removal.  Labs today.   Good to see you today Return as needed or in 1 year for next physical.   Follow up plan: Return in about 1 year (around 12/30/2022) for medicare wellness visit.  Ria Bush, MD

## 2021-12-29 NOTE — Assessment & Plan Note (Signed)

## 2022-01-05 ENCOUNTER — Telehealth: Payer: Self-pay

## 2022-01-05 NOTE — Telephone Encounter (Signed)
Received faxed CBC lab results from 12/29/21.  Per Dr. Darnell Level, results are normal.  Spoke with pt relaying results.  Verbalizes understanding.    Placed results to be scanned.

## 2022-01-21 ENCOUNTER — Telehealth: Payer: Self-pay | Admitting: Family Medicine

## 2022-01-21 NOTE — Telephone Encounter (Signed)
LVM for pt to rtn my call to schedule AWV with [facility]. AWV was not done on 12/29/21 by PCP

## 2022-01-21 NOTE — Telephone Encounter (Signed)
Not done one on 01/10/22

## 2022-02-02 ENCOUNTER — Other Ambulatory Visit: Payer: Self-pay | Admitting: Family Medicine

## 2022-02-02 DIAGNOSIS — N529 Male erectile dysfunction, unspecified: Secondary | ICD-10-CM

## 2022-02-19 ENCOUNTER — Ambulatory Visit (INDEPENDENT_AMBULATORY_CARE_PROVIDER_SITE_OTHER): Payer: Medicare Other

## 2022-02-19 VITALS — Ht 66.75 in | Wt 167.0 lb

## 2022-02-19 DIAGNOSIS — Z0000 Encounter for general adult medical examination without abnormal findings: Secondary | ICD-10-CM | POA: Diagnosis not present

## 2022-02-19 NOTE — Progress Notes (Signed)
Virtual Visit via Telephone Note  I connected with  Dustin Cole on 02/19/22 at  9:45 AM EST by telephone and verified that I am speaking with the correct person using two identifiers.  Location: Patient: home Provider: Petrey Persons participating in the virtual visit: [place]   I discussed the limitations, risks, security and privacy concerns of performing an evaluation and management service by telephone and the availability of in person appointments. The patient expressed understanding and agreed to proceed.  Interactive audio and video telecommunications were attempted between this nurse and patient, however failed, due to patient having technical difficulties OR patient did not have access to video capability.  We continued and completed visit with audio only.  Some vital signs may be absent or patient reported.   Dustin David, LPN  Subjective:   Dustin Cole is a 71 y.o. male who presents for Medicare Annual/Subsequent preventive examination.  Review of Systems     Cardiac Risk Factors include: advanced age (>41mn, >>54women);male gender     Objective:    There were no vitals filed for this visit. There is no height or weight on file to calculate BMI.     02/19/2022    9:46 AM 12/23/2018    9:48 AM 12/10/2017    9:30 AM  Advanced Directives  Does Patient Have a Medical Advance Directive? No Yes Yes  Type of ACorporate treasurerof ALa CroftLiving will HRenningersLiving will  Copy of HHawaiian [place] Chart?  No - copy requested No - copy requested  Would patient like information on creating a medical advance directive? No - Patient declined      Current Medications (verified) Outpatient Encounter Medications as of 02/19/2022  Medication Sig   aspirin 81 MG tablet Take 81 mg by mouth daily.   Multiple Vitamin (MULTIVITAMIN) tablet Take 1 tablet by mouth daily.   Multiple  Vitamins-Minerals (PRESERVISION AREDS PO) Take by mouth daily.   sildenafil (REVATIO) 20 MG tablet TAKE 2 TO 5 TABLETS BY MOUTH ONCE DAILY AS NEEDED FOR RELATIONS   No facility-administered encounter medications on file as of 02/19/2022.    Allergies (verified) Patient has no known allergies.   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   Family History  Problem Relation Age of Onset   Cancer Mother        breast   Clotting disorder Brother        ?hereditary spherocytosis   Cancer Father 765      prostate   CAD Father [phone]      CABG   Stroke Neg Hx    Diabetes Neg Hx    Hypertension Neg Hx    Social History   Socioeconomic History   Marital status: Married    Spouse name: Not on file   Number of children: Not on file   Years of education: Not on file   Highest education level: Not on file  Occupational History   Not on file  Tobacco Use  Smoking status: Never   Smokeless tobacco: Never  Vaping Use   Vaping Use: Never used  Substance and Sexual Activity   Alcohol use: No    Alcohol/week: 0.0 standard drinks of alcohol   Drug use: No   Sexual activity: Yes  Other Topics Concern   Not on file  Social History Narrative   Lives with wife, dogs   Occupation: Insurance underwriter    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   Diet: good water, fruit/svegetables daily, some chicken and fish   Social Determinants of Radio broadcast assistant Strain: Low Risk  (02/19/2022)   Overall Financial Resource Strain (CARDIA)    Difficulty of Paying Living Expenses: Not hard at all  Food Insecurity: No Food Insecurity (02/19/2022)    Hunger Vital Sign    Worried About Running Out of Food in the Last Year: Never true    [place] in the Last Year: Never true  Transportation Needs: No Transportation Needs (02/19/2022)   PRAPARE - Hydrologist (Medical): No    Lack of Transportation (Non-Medical): No  Physical Activity: Sufficiently Active (02/19/2022)   Exercise Vital Sign    Days of Exercise per Week: 6 days    Minutes of Exercise per Session: 60 min  Stress: No Stress Concern Present (02/19/2022)   [place]    Feeling of Stress : Not at all  Social Connections: Moderately Integrated (02/19/2022)   Social Connection and Isolation Panel [NHANES]    Frequency of Communication with Friends and Family: More than three times a week    Frequency of Social Gatherings with Friends and Family: More than three times a week    Attends Religious Services: More than 4 times per year    Active Member of [employer] or Organizations: No    Attends Music therapist: Never    Marital Status: Married    Tobacco Counseling Counseling given: Not Answered   Clinical Intake:  Pre-visit preparation completed: Yes  Pain : No/denies pain     Nutritional Risks: None Diabetes: No  How often do you need to have someone help you when you read instructions, pamphlets, or other written materials from your doctor or pharmacy?: 1 - Never  Diabetic?no  Interpreter Needed?: No  Information entered by :: Kirke Shaggy, LPN   Activities of Daily Living    02/19/2022    9:47 AM  In your present state of health, do you have any difficulty performing the following activities:  Hearing? 0  Vision? 0  Difficulty concentrating or making decisions? 0  Walking or climbing stairs? 0  Dressing or bathing? 0  Doing errands, shopping? 0  Preparing Food and eating ? N  Using the Toilet? N  In the past six months, have you  accidently leaked urine? N  Do you have problems with loss of bowel control? N  Managing your Medications? N  Managing your Finances? N  Housekeeping or managing your Housekeeping? N    Patient Care Team: Ria Bush, MD as PCP - General (Family Medicine)  Indicate any recent Medical Services you may have received from other than [facility] providers in the past year (date may be approximate).     Assessment:   This is a routine wellness examination for Ac.  Hearing/Vision screen Hearing Screening - Comments:: No aids Vision Screening - Comments:: Readers, glasses to drive- Dr.Bell  Dietary issues and  exercise activities discussed: Current Exercise Habits: The patient has a physically strenuous job, but has no regular exercise apart from work., Type of exercise: walking, Time (Minutes): 60, Frequency (Times/Week): 6, Weekly Exercise (Minutes/Week): 360, Intensity: Mild   Goals Addressed             This Visit's Progress    DIET - EAT MORE FRUITS AND VEGETABLES         Depression Screen    02/19/2022    9:44 AM 01/10/2021    7:57 AM 01/05/2020    7:22 AM 12/23/2018    9:48 AM 12/10/2017    9:30 AM 12/04/2016   10:19 AM 05/18/2016    8:54 AM  PHQ 2/9 Scores  PHQ - 2 Score 0 0 0 0 0 0 0  PHQ- 9 Score 0   0 0      Fall Risk    02/19/2022    9:46 AM 01/10/2021    7:57 AM 12/23/2018    9:48 AM 12/10/2017    9:30 AM 12/04/2016   10:19 AM  Fall Risk   Falls in the past year? 0 0 0 No No  Number falls in past yr: 0      Injury with Fall? 0      Risk for fall due to : No Fall Risks      Follow up Falls prevention discussed;Falls evaluation completed  Falls evaluation completed;Falls prevention discussed      FALL RISK PREVENTION PERTAINING TO THE HOME:  Any stairs in or around the home? No  If so, are there any without handrails? No  Home free of loose throw rugs in walkways, pet beds, electrical cords, etc? No  Adequate lighting in your home to reduce risk of falls?  No   ASSISTIVE DEVICES UTILIZED TO PREVENT FALLS:  Life alert? No  Use of a cane, walker or w/c? No  Grab bars in the bathroom? No  Shower chair or bench in shower? No  Elevated toilet seat or a handicapped toilet? No     Cognitive Function:    12/23/2018    9:51 AM 12/10/2017    9:30 AM  MMSE - Mini Mental State Exam  Orientation to time 5 5  Orientation to Place 5 5  Registration 3 3  Attention/ Calculation 5 0  Recall 3 3  Language- name 2 objects 0 0  Language- repeat 1 1  Language- follow 3 step command 0 3  Language- read & follow direction 0 0  Write a sentence 0 0  Copy design 0 0  Total score 22 20        02/19/2022    9:47 AM  6CIT Screen  What Year? 0 points  What month? 0 points  What time? 0 points  Count back from 20 0 points  Months in reverse 0 points  Repeat phrase 0 points  Total Score 0 points    Immunizations Immunization History  Administered Date(s) Administered   Fluad Quad(high Dose 65+) 12/30/2018   Influenza,inj,Quad PF,6+ Mos 01/19/2017, 12/17/2017   Influenza-Unspecified 01/15/2014, 01/09/2015, 01/12/2016   PPD Test 07/27/2014, 07/23/2015, 09/15/2016, 09/29/2017, 11/09/2018, 09/26/2019, 09/17/2020   Pneumococcal Conjugate-13 12/04/2016   Pneumococcal Polysaccharide-23 12/22/2013   Tdap 12/22/2013   Zoster, Live 12/16/2012    TDAP status: Up to date  Flu Vaccine status: Due, Education has been provided regarding the importance of this vaccine. Advised may receive this vaccine at local pharmacy or Health Dept. Aware to provide a copy  of the vaccination record if obtained from local pharmacy or Health Dept. Verbalized acceptance and understanding.  Pneumococcal vaccine status: Up to date  Covid-19 vaccine status: Declined, Education has been provided regarding the importance of this vaccine but patient still declined. Advised may receive this vaccine at local pharmacy or Health Dept.or vaccine clinic. Aware to provide a copy of the  vaccination record if obtained from local pharmacy or Health Dept. Verbalized acceptance and understanding.  Qualifies for Shingles Vaccine? Yes   Zostavax completed Yes   Shingrix Completed?: No.    Education has been provided regarding the importance of this vaccine. Patient has been advised to call insurance company to determine out of pocket expense if they have not yet received this vaccine. Advised may also receive vaccine at local pharmacy or Health Dept. Verbalized acceptance and understanding.  Screening Tests Health Maintenance  Topic Date Due   COVID-19 Vaccine (1) Never done   Meningococcal B Vaccine (1 of 4 - Increased Risk) Never done   Zoster Vaccines- Shingrix (1 of 2) Never done   Pneumonia Vaccine 39+ Years old (3 - PPSV23 or PCV20) 12/23/2018   INFLUENZA VACCINE  07/12/2022 (Originally 11/11/2021)   Medicare Annual Wellness (AWV)  02/20/2023   TETANUS/TDAP  12/23/2023   Fecal DNA (Cologuard)  01/18/2024   Hepatitis C Screening  Completed   HPV VACCINES  Aged Out    Health Maintenance  Health Maintenance Due  Topic Date Due   COVID-19 Vaccine (1) Never done   Meningococcal B Vaccine (1 of 4 - Increased Risk) Never done   Zoster Vaccines- Shingrix (1 of 2) Never done   Pneumonia Vaccine 1+ Years old (3 - PPSV23 or PCV20) 12/23/2018    Colorectal cancer screening: Type of screening: Colonoscopy. Completed 06/24/21. Repeat every 3 years  Lung Cancer Screening: (Low Dose CT Chest recommended if Age 49-80 years, 30 pack-year currently smoking OR have quit w/in 15years.) does not qualify.   Additional Screening:  Hepatitis C Screening: does qualify; Completed 04/24/16  Vision Screening: Recommended annual ophthalmology exams for early detection of glaucoma and other disorders of the eye. Is the patient up to date with their annual eye exam?  Yes  Who is the provider or what is the name of the office in which the patient attends annual eye exams? Dr.Bell If pt is not  established with a provider, would they like to be referred to a provider to establish care? No .   Dental Screening: Recommended annual dental exams for proper oral hygiene  Community Resource Referral / Chronic Care Management: CRR required this visit?  No   CCM required this visit?  No      Plan:     I have personally reviewed and noted the following in the patient's chart:   Medical and social history Use of alcohol, tobacco or illicit drugs  Current medications and supplements including opioid prescriptions. Patient is not currently taking opioid prescriptions. Functional ability and status Nutritional status Physical activity Advanced directives List of other physicians Hospitalizations, surgeries, and ER visits in previous 12 months Vitals Screenings to include cognitive, depression, and falls Referrals and appointments  In addition, I have reviewed and discussed with patient certain preventive protocols, quality metrics, and best practice recommendations. A written personalized care plan for preventive services as well as general preventive health recommendations were provided to patient.     Dustin David, LPN   [id number]   Nurse Notes: none

## 2022-02-19 NOTE — Patient Instructions (Signed)
Dustin Cole , Thank you for taking time to come for your Medicare Wellness Visit. I appreciate your ongoing commitment to your health goals. Please review the following plan we discussed and let me know if I can assist you in the future.   Screening recommendations/referrals: Colonoscopy: 06/24/21 Recommended yearly ophthalmology/optometry visit for glaucoma screening and checkup Recommended yearly dental visit for hygiene and checkup  Vaccinations: Influenza vaccine: n/d Pneumococcal vaccine: 12/04/16 Tdap vaccine: 12/22/13 Shingles vaccine: Zostavax 12/16/12   Covid-19: n/d  Advanced directives: n/d  Conditions/risks identified: none  Next appointment: Follow up in one year for your annual wellness visit. 02/22/23 @ 3:15 pm by phone  Preventive Care 70 Years and Older, Male Preventive care refers to lifestyle choices and visits with your health care provider that can promote health and wellness. What does preventive care include? A yearly physical exam. This is also called an annual well check. Dental exams once or twice a year. Routine eye exams. Ask your health care provider how often you should have your eyes checked. Personal lifestyle choices, including: Daily care of your teeth and gums. Regular physical activity. Eating a healthy diet. Avoiding tobacco and drug use. Limiting alcohol use. Practicing safe sex. Taking low doses of aspirin every day. Taking vitamin and mineral supplements as recommended by your health care provider. What happens during an annual well check? The services and screenings done by your health care provider during your annual well check will depend on your age, overall health, lifestyle risk factors, and family history of disease. Counseling  Your health care provider may ask you questions about your: Alcohol use. Tobacco use. Drug use. Emotional well-being. Home and relationship well-being. Sexual activity. Eating habits. History of  falls. Memory and ability to understand (cognition). Work and work Statistician. Screening  You may have the following tests or measurements: Height, weight, and BMI. Blood pressure. Lipid and cholesterol levels. These may be checked every 5 years, or more frequently if you are over 70 years old. Skin check. Lung cancer screening. You may have this screening every year starting at age 70 if you have a 30-pack-year history of smoking and currently smoke or have quit within the past 15 years. Fecal occult blood test (FOBT) of the stool. You may have this test every year starting at age 70. Flexible sigmoidoscopy or colonoscopy. You may have a sigmoidoscopy every 5 years or a colonoscopy every 10 years starting at age 70. Prostate cancer screening. Recommendations will vary depending on your family history and other risks. Hepatitis C blood test. Hepatitis B blood test. Sexually transmitted disease (STD) testing. Diabetes screening. This is done by checking your blood sugar (glucose) after you have not eaten for a while (fasting). You may have this done every 1-3 years. Abdominal aortic aneurysm (AAA) screening. You may need this if you are a current or former smoker. Osteoporosis. You may be screened starting at age 91 if you are at high risk. Talk with your health care provider about your test results, treatment options, and if necessary, the need for more tests. Vaccines  Your health care provider may recommend certain vaccines, such as: Influenza vaccine. This is recommended every year. Tetanus, diphtheria, and acellular pertussis (Tdap, Td) vaccine. You may need a Td booster every 10 years. Zoster vaccine. You may need this after age 70. Pneumococcal 13-valent conjugate (PCV13) vaccine. One dose is recommended after age 70. Pneumococcal polysaccharide (PPSV23) vaccine. One dose is recommended after age 70. Talk to your health care provider  about which screenings and vaccines you need and  how often you need them. This information is not intended to replace advice given to you by your health care provider. Make sure you discuss any questions you have with your health care provider. Document Released: 04/26/2015 Document Revised: 12/18/2015 Document Reviewed: 01/29/2015 Elsevier Interactive Patient Education  2017 [place] Prevention in the Home Falls can cause injuries. They can happen to people of all ages. There are many things you can do to make your home safe and to help prevent falls. What can I do on the outside of my home? Regularly fix the edges of walkways and driveways and fix any cracks. Remove anything that might make you trip as you walk through a door, such as a raised step or threshold. Trim any bushes or trees on the path to your home. Use bright outdoor lighting. Clear any walking paths of anything that might make someone trip, such as rocks or tools. Regularly check to see if handrails are loose or broken. Make sure that both sides of any steps have handrails. Any raised decks and porches should have guardrails on the edges. Have any leaves, snow, or ice cleared regularly. Use sand or salt on walking paths during winter. Clean up any spills in your garage right away. This includes oil or grease spills. What can I do in the bathroom? Use night lights. Install grab bars by the toilet and in the tub and shower. Do not use towel bars as grab bars. Use non-skid mats or decals in the tub or shower. If you need to sit down in the shower, use a plastic, non-slip stool. Keep the floor dry. Clean up any water that spills on the floor as soon as it happens. Remove soap buildup in the tub or shower regularly. Attach bath mats securely with double-sided non-slip rug tape. Do not have throw rugs and other things on the floor that can make you trip. What can I do in the bedroom? Use night lights. Make sure that you have a light by your bed that is easy to  reach. Do not use any sheets or blankets that are too big for your bed. They should not hang down onto the floor. Have a firm chair that has side arms. You can use this for support while you get dressed. Do not have throw rugs and other things on the floor that can make you trip. What can I do in the kitchen? Clean up any spills right away. Avoid walking on wet floors. Keep items that you use a lot in easy-to-reach places. If you need to reach something above you, use a strong step stool that has a grab bar. Keep electrical cords out of the way. Do not use floor polish or wax that makes floors slippery. If you must use wax, use non-skid floor wax. Do not have throw rugs and other things on the floor that can make you trip. What can I do with my stairs? Do not leave any items on the stairs. Make sure that there are handrails on both sides of the stairs and use them. Fix handrails that are broken or loose. Make sure that handrails are as long as the stairways. Check any carpeting to make sure that it is firmly attached to the stairs. Fix any carpet that is loose or worn. Avoid having throw rugs at the top or bottom of the stairs. If you do have throw rugs, attach them to the floor  with carpet tape. Make sure that you have a light switch at the top of the stairs and the bottom of the stairs. If you do not have them, ask someone to add them for you. What else can I do to help prevent falls? Wear shoes that: Do not have high heels. Have rubber bottoms. Are comfortable and fit you well. Are closed at the toe. Do not wear sandals. If you use a stepladder: Make sure that it is fully opened. Do not climb a closed stepladder. Make sure that both sides of the stepladder are locked into place. Ask someone to hold it for you, if possible. Clearly mark and make sure that you can see: Any grab bars or handrails. First and last steps. Where the edge of each step is. Use tools that help you move  around (mobility aids) if they are needed. These include: Canes. Walkers. Scooters. Crutches. Turn on the lights when you go into a dark area. Replace any light bulbs as soon as they burn out. Set up your furniture so you have a clear path. Avoid moving your furniture around. If any of your floors are uneven, fix them. If there are any pets around you, be aware of where they are. Review your medicines with your doctor. Some medicines can make you feel dizzy. This can increase your chance of falling. Ask your doctor what other things that you can do to help prevent falls. This information is not intended to replace advice given to you by your health care provider. Make sure you discuss any questions you have with your health care provider. Document Released: 01/24/2009 Document Revised: 09/05/2015 Document Reviewed: 05/04/2014 Elsevier Interactive Patient Education  2017 [employer].

## 2022-07-07 DIAGNOSIS — L309 Dermatitis, unspecified: Secondary | ICD-10-CM | POA: Diagnosis not present

## 2022-07-07 DIAGNOSIS — L821 Other seborrheic keratosis: Secondary | ICD-10-CM | POA: Diagnosis not present

## 2022-07-07 DIAGNOSIS — D225 Melanocytic nevi of trunk: Secondary | ICD-10-CM | POA: Diagnosis not present

## 2022-08-19 ENCOUNTER — Other Ambulatory Visit: Payer: Self-pay | Admitting: Family Medicine

## 2022-08-19 DIAGNOSIS — N529 Male erectile dysfunction, unspecified: Secondary | ICD-10-CM

## 2022-11-27 ENCOUNTER — Encounter: Payer: Medicare Other | Admitting: Family Medicine

## 2022-12-07 DIAGNOSIS — H903 Sensorineural hearing loss, bilateral: Secondary | ICD-10-CM | POA: Diagnosis not present

## 2022-12-29 DIAGNOSIS — H903 Sensorineural hearing loss, bilateral: Secondary | ICD-10-CM | POA: Diagnosis not present

## 2022-12-29 DIAGNOSIS — H524 Presbyopia: Secondary | ICD-10-CM | POA: Diagnosis not present

## 2022-12-29 DIAGNOSIS — H25813 Combined forms of age-related cataract, bilateral: Secondary | ICD-10-CM | POA: Diagnosis not present

## 2022-12-29 DIAGNOSIS — H353131 Nonexudative age-related macular degeneration, bilateral, early dry stage: Secondary | ICD-10-CM | POA: Diagnosis not present

## 2022-12-30 ENCOUNTER — Other Ambulatory Visit: Payer: Self-pay | Admitting: Family Medicine

## 2022-12-30 DIAGNOSIS — Z9081 Acquired absence of spleen: Secondary | ICD-10-CM

## 2022-12-30 DIAGNOSIS — D751 Secondary polycythemia: Secondary | ICD-10-CM

## 2022-12-30 DIAGNOSIS — E785 Hyperlipidemia, unspecified: Secondary | ICD-10-CM

## 2022-12-30 DIAGNOSIS — Z125 Encounter for screening for malignant neoplasm of prostate: Secondary | ICD-10-CM

## 2022-12-31 ENCOUNTER — Other Ambulatory Visit: Payer: Self-pay | Admitting: Family Medicine

## 2023-01-01 ENCOUNTER — Other Ambulatory Visit: Payer: Medicare HMO

## 2023-01-08 ENCOUNTER — Encounter: Payer: Medicare Other | Admitting: Family Medicine

## 2023-01-29 ENCOUNTER — Other Ambulatory Visit (INDEPENDENT_AMBULATORY_CARE_PROVIDER_SITE_OTHER): Payer: Medicare HMO

## 2023-01-29 DIAGNOSIS — D751 Secondary polycythemia: Secondary | ICD-10-CM

## 2023-01-29 DIAGNOSIS — Z9081 Acquired absence of spleen: Secondary | ICD-10-CM | POA: Diagnosis not present

## 2023-01-29 DIAGNOSIS — Z125 Encounter for screening for malignant neoplasm of prostate: Secondary | ICD-10-CM

## 2023-01-29 DIAGNOSIS — E785 Hyperlipidemia, unspecified: Secondary | ICD-10-CM

## 2023-01-29 LAB — COMPREHENSIVE METABOLIC PANEL
ALT: 30 U/L (ref 0–53)
AST: 23 U/L (ref 0–37)
Albumin: 4.1 g/dL (ref 3.5–5.2)
Alkaline Phosphatase: 73 U/L (ref 39–117)
BUN: 12 mg/dL (ref 6–23)
CO2: 30 meq/L (ref 19–32)
Calcium: 9 mg/dL (ref 8.4–10.5)
Chloride: 99 meq/L (ref 96–112)
Creatinine, Ser: 0.88 mg/dL (ref 0.40–1.50)
GFR: 86.66 mL/min (ref >60.00)
Glucose, Bld: 84 mg/dL (ref 70–99)
Potassium: 3.8 meq/L (ref 3.5–5.1)
Sodium: 138 meq/L (ref 135–145)
Total Bilirubin: 1.3 mg/dL — ABNORMAL HIGH (ref 0.2–1.2)
Total Protein: 6.5 g/dL (ref 6.0–8.3)

## 2023-01-29 LAB — CBC WITH DIFFERENTIAL/PLATELET
Basophils Absolute: 0.1 10*3/uL (ref 0.0–0.1)
Basophils Relative: 0.6 % (ref 0.0–3.0)
Eosinophils Absolute: 0.2 10*3/uL (ref 0.0–0.7)
Eosinophils Relative: 1.6 % (ref 0.0–5.0)
HCT: 46.1 % (ref 39.0–52.0)
Hemoglobin: 16.1 g/dL (ref 13.0–17.0)
Lymphocytes Relative: 12.1 % (ref 12.0–46.0)
Lymphs Abs: 1.7 10*3/uL (ref 0.7–4.0)
MCHC: 34.8 g/dL (ref 30.0–36.0)
MCV: 86.9 fL (ref 78.0–100.0)
Monocytes Absolute: 1.4 10*3/uL — ABNORMAL HIGH (ref 0.1–1.0)
Monocytes Relative: 10.3 % (ref 3.0–12.0)
Neutro Abs: 10.5 10*3/uL — ABNORMAL HIGH (ref 1.4–7.7)
Neutrophils Relative %: 75.4 % (ref 43.0–77.0)
Platelets: 353 10*3/uL (ref 150.0–400.0)
RBC: 5.3 Mil/uL (ref 4.22–5.81)
RDW: 14 % (ref 11.5–15.5)
WBC: 13.9 10*3/uL — ABNORMAL HIGH (ref 4.0–10.5)

## 2023-01-29 LAB — LIPID PANEL
Cholesterol: 186 mg/dL (ref 0–200)
HDL: 45.4 mg/dL (ref >39.00)
LDL Cholesterol: 123 mg/dL — ABNORMAL HIGH (ref 0–99)
NonHDL: 140.67
Total CHOL/HDL Ratio: 4
Triglycerides: 90 mg/dL (ref 0.0–149.0)
VLDL: 18 mg/dL (ref 0.0–40.0)

## 2023-01-29 LAB — PSA: PSA: 2.25 ng/mL (ref 0.10–4.00)

## 2023-02-03 ENCOUNTER — Encounter: Payer: Self-pay | Admitting: Family Medicine

## 2023-02-03 ENCOUNTER — Ambulatory Visit (INDEPENDENT_AMBULATORY_CARE_PROVIDER_SITE_OTHER): Payer: Medicare HMO | Admitting: Family Medicine

## 2023-02-03 VITALS — BP 122/80 | HR 96 | Temp 97.8°F | Ht 66.25 in | Wt 168.4 lb

## 2023-02-03 DIAGNOSIS — Z9081 Acquired absence of spleen: Secondary | ICD-10-CM | POA: Diagnosis not present

## 2023-02-03 DIAGNOSIS — L409 Psoriasis, unspecified: Secondary | ICD-10-CM

## 2023-02-03 DIAGNOSIS — D580 Hereditary spherocytosis: Secondary | ICD-10-CM

## 2023-02-03 DIAGNOSIS — Z0000 Encounter for general adult medical examination without abnormal findings: Secondary | ICD-10-CM

## 2023-02-03 DIAGNOSIS — N529 Male erectile dysfunction, unspecified: Secondary | ICD-10-CM | POA: Diagnosis not present

## 2023-02-03 DIAGNOSIS — H6591 Unspecified nonsuppurative otitis media, right ear: Secondary | ICD-10-CM | POA: Diagnosis not present

## 2023-02-03 DIAGNOSIS — E785 Hyperlipidemia, unspecified: Secondary | ICD-10-CM

## 2023-02-03 DIAGNOSIS — H6590 Unspecified nonsuppurative otitis media, unspecified ear: Secondary | ICD-10-CM | POA: Insufficient documentation

## 2023-02-03 NOTE — Assessment & Plan Note (Signed)
Chronic, not on statin. Reviewed healthy diet and lifestyle choices to affect sustainable weight loss.  The 10-year ASCVD risk score (Arnett DK, et al., 2019) is: 18%   Values used to calculate the score:     Age: 72 years     Sex: Male     Is Non-Hispanic African American: No     Diabetic: No     Tobacco smoker: No     Systolic Blood Pressure: 122 mmHg     Is BP treated: No     HDL Cholesterol: 45.4 mg/dL     Total Cholesterol: 186 mg/dL

## 2023-02-03 NOTE — Assessment & Plan Note (Addendum)
Adults who have already received both a PCV and PPSV23 vaccine require no further vaccination.  rec meningococcal vaccine Q5 yrs + menB series Q2-3 yrs - it seems pt will need to get meningitis vaccine through pharmacy for medicare to cover this. He will check at pharmacy on this.

## 2023-02-03 NOTE — Patient Instructions (Addendum)
If interested, check with pharmacy about new 2 shot shingles series (shingrix).  Meningococcal vaccine through local pharmacy  You have fluid in right middle ear - start flonase nasal steroid over the counter 2 squirts into each nostril daily for next few weeks. Let us know if no better over next 4-6 weeks for referral to ENT.  Return as needed or in 1 year for next physical/wellness visit

## 2023-02-03 NOTE — Assessment & Plan Note (Signed)
Preventative protocols reviewed and updated unless pt declined. Discussed healthy diet and lifestyle.  

## 2023-02-03 NOTE — Assessment & Plan Note (Signed)
Symptoms present for 1 month since driving up to [facility].  Exam with serous otitis.  Discussed flonase use.  Update if ongoing symptoms past 4-6 wks for ENT referral/eval.

## 2023-02-03 NOTE — Progress Notes (Signed)
Ph: [phone] Fax: [phone]   Patient ID: Dustin Cole, male    DOB: [birth year], 72 y.o.   MRN: [id number]  This visit was conducted in person.  BP 122/80   Pulse 96   Temp 97.8 F (36.6 C) (Oral)   Ht 5' 6.25" (1.683 m)   Wt 168 lb 6 oz (76.4 kg)   SpO2 98%   BMI 26.97 kg/m    CC: CPE Subjective:   HPI: Dustin Cole is a 72 y.o. male presenting on 02/03/2023 for Medicare Wellness   Too soon for AMW. Did not see health advisor yet this year - but has upcoming appt 02/2023 with health advisor.    Hearing Screening   500Hz  1000Hz  2000Hz  4000Hz   Right ear 0 0 40 0  Left ear 40 0 0 0  Comments: Wears B hearing aids. Not wearing at today's OV.   Vision Screening - Comments:: Last eye exam, 12/2022.  Flowsheet Row Office Visit from 02/03/2023 in [facility] [place]  PHQ-2 Total Score 0     New hearing aides as of last year     02/03/2023   12:26 PM 02/19/2022    9:46 AM 01/10/2021    7:57 AM 12/23/2018    9:48 AM 12/10/2017    9:30 AM  Fall Risk   Falls in the past year? 0 0 0 0 No  Number falls in past yr:  0     Injury with Fall?  0     Risk for fall due to :  No Fall Risks     Follow up  Falls prevention discussed;Falls evaluation completed  Falls evaluation completed;Falls prevention discussed    Recent vacation trip to [place] in [place] - ears popped on trip, persistent muffled hearing on right since then. No ear pain or drainage. Tried mucinex without benefit. Ongoing symptoms for the past month.   Care Team:  GI - Dr Presley Raddle PCP - Sharen Hones   Preventative: COLONOSCOPY 06/2021 - fair colon prep, TA, SSPx2, diverticulosis, rpt 3 yrs with 2d prep Deboraha Sprang Dr Marca Ancona)  Prostate cancer screening - yearly.  Lung cancer screening - not eligible  Flu shot yearly  COVID vaccine - discussed, declines  Pneumovax 12/2013. Prevnar-13 2018.  Meningitis vaccine - will need to get through pharmacy  Tdap 12/2013   Zostavax with [facility] 12/2012  Shingrix - discussed  Advanced directive: scanned in chart 12/2017. Wife Dermont Quarterman then niece Zyen Acker are HCPOA. Does not want life prolonging measures if terminally ill.  Seat belt use discussed  Sunscreen use discussed, denies changing moles.  Sleep - averages 7-8 hours/night Non smoker  Alcohol - none  Dentist Q6 mo  Eye exam - yearly  Bowel - no constipation Bladder - no incontinence   Caffeine: 3 coffee daily Lives with wife, dogs  Occupation: tile  Activity: active labor, active around the house, cuts wood, exercise bike and walking Diet: good water, fruit/svegetables daily, some chicken and fish      Relevant past medical, surgical, family and social history reviewed and updated as indicated. Interim medical history since our last visit reviewed. Allergies and medications reviewed and updated. Outpatient Medications Prior to Visit  Medication Sig Dispense Refill   aspirin 81 MG tablet Take 81 mg by mouth daily.     Multiple Vitamin (MULTIVITAMIN) tablet Take 1 tablet by mouth daily.     Multiple Vitamins-Minerals (PRESERVISION AREDS PO) Take by mouth daily.  sildenafil (REVATIO) 20 MG tablet TAKE TWO TO FIVE TABLETS BY MOUTH ONCE DAILY AS NEEDED FOR RELATIONS 30 tablet 1   No facility-administered medications prior to visit.     Per HPI unless specifically indicated in ROS section below Review of Systems  Constitutional:  Negative for activity change, appetite change, chills, fatigue, fever and unexpected weight change.  HENT:  Positive for hearing loss (R sided). Negative for congestion.   Eyes:  Negative for visual disturbance.  Respiratory:  Negative for cough, chest tightness, shortness of breath and wheezing.   Cardiovascular:  Negative for chest pain, palpitations and leg swelling.  Gastrointestinal:  Negative for abdominal distention, abdominal pain, blood in stool, constipation, diarrhea, nausea and vomiting.  Endocrine:        No night sweats  Genitourinary:  Negative for difficulty urinating and hematuria.  Musculoskeletal:  Negative for arthralgias, myalgias and neck pain.  Skin:  Negative for rash.  Neurological:  Negative for dizziness, seizures, syncope and headaches.  Hematological:  Negative for adenopathy. Does not bruise/bleed easily.  Psychiatric/Behavioral:  Negative for dysphoric mood. The patient is not nervous/anxious.     Objective:  BP 122/80   Pulse 96   Temp 97.8 F (36.6 C) (Oral)   Ht 5' 6.25" (1.683 m)   Wt 168 lb 6 oz (76.4 kg)   SpO2 98%   BMI 26.97 kg/m   Wt Readings from Last 3 Encounters:  02/03/23 168 lb 6 oz (76.4 kg)  02/19/22 167 lb (75.8 kg)  12/29/21 167 lb 4 oz (75.9 kg)      Physical Exam Vitals and nursing note reviewed.  Constitutional:      General: He is not in acute distress.    Appearance: Normal appearance. He is well-developed. He is not ill-appearing.  HENT:     Head: Normocephalic and atraumatic.     Right Ear: Hearing, tympanic membrane, ear canal and external ear normal.     Left Ear: Ear canal and external ear normal. Decreased hearing noted. A middle ear effusion is present. Tympanic membrane is not injected, perforated, erythematous or bulging.     Nose: Nose normal.     Mouth/Throat:     Mouth: Mucous membranes are moist.     Pharynx: Oropharynx is clear. No oropharyngeal exudate or posterior oropharyngeal erythema.  Eyes:     General: No scleral icterus.    Extraocular Movements: Extraocular movements intact.     Conjunctiva/sclera: Conjunctivae normal.     Pupils: Pupils are equal, round, and reactive to light.  Neck:     Thyroid: No thyroid mass or thyromegaly.  Cardiovascular:     Rate and Rhythm: Normal rate and regular rhythm.     Pulses: Normal pulses.          Radial pulses are 2+ on the right side and 2+ on the left side.     Heart sounds: Normal heart sounds. No murmur heard. Pulmonary:     Effort: Pulmonary effort is  normal. No respiratory distress.     Breath sounds: Normal breath sounds. No wheezing, rhonchi or rales.  Abdominal:     General: Bowel sounds are normal. There is no distension.     Palpations: Abdomen is soft. There is no mass.     Tenderness: There is no abdominal tenderness. There is no guarding or rebound.     Hernia: No hernia is present.  Musculoskeletal:        General: Normal range of motion.  Cervical back: Normal range of motion and neck supple.     Right lower leg: No edema.     Left lower leg: No edema.  Lymphadenopathy:     Cervical: No cervical adenopathy.  Skin:    General: Skin is warm and dry.     Findings: No rash.  Neurological:     General: No focal deficit present.     Mental Status: He is alert and oriented to person, place, and time.  Psychiatric:        Mood and Affect: Mood normal.        Behavior: Behavior normal.        Thought Content: Thought content normal.        Judgment: Judgment normal.       Results for orders placed or performed in visit on 01/29/23  PSA  Result Value Ref Range   PSA 2.25 0.10 - 4.00 ng/mL  CBC with Differential/Platelet  Result Value Ref Range   WBC 13.9 (H) 4.0 - 10.5 K/uL   RBC 5.30 4.22 - 5.81 Mil/uL   Hemoglobin 16.1 13.0 - 17.0 g/dL   HCT 11.9 14.7 - 82.9 %   MCV 86.9 78.0 - 100.0 fl   MCHC 34.8 30.0 - 36.0 g/dL   RDW 56.2 13.0 - 86.5 %   Platelets 353.0 150.0 - 400.0 K/uL   Neutrophils Relative % 75.4 43.0 - 77.0 %   Lymphocytes Relative 12.1 12.0 - 46.0 %   Monocytes Relative 10.3 3.0 - 12.0 %   Eosinophils Relative 1.6 0.0 - 5.0 %   Basophils Relative 0.6 0.0 - 3.0 %   Neutro Abs 10.5 (H) 1.4 - 7.7 K/uL   Lymphs Abs 1.7 0.7 - 4.0 K/uL   Monocytes Absolute 1.4 (H) 0.1 - 1.0 K/uL   Eosinophils Absolute 0.2 0.0 - 0.7 K/uL   Basophils Absolute 0.1 0.0 - 0.1 K/uL  Comprehensive metabolic panel  Result Value Ref Range   Sodium 138 135 - 145 mEq/L   Potassium 3.8 3.5 - 5.1 mEq/L   Chloride 99 96 - 112  mEq/L   CO2 30 19 - 32 mEq/L   Glucose, Bld 84 70 - 99 mg/dL   BUN 12 6 - 23 mg/dL   Creatinine, Ser 7.84 0.40 - 1.50 mg/dL   Total Bilirubin 1.3 (H) 0.2 - 1.2 mg/dL   Alkaline Phosphatase 73 39 - 117 U/L   AST 23 0 - 37 U/L   ALT 30 0 - 53 U/L   Total Protein 6.5 6.0 - 8.3 g/dL   Albumin 4.1 3.5 - 5.2 g/dL   GFR 69.62 >95.28 mL/min   Calcium 9.0 8.4 - 10.5 mg/dL  Lipid panel  Result Value Ref Range   Cholesterol 186 0 - 200 mg/dL   Triglycerides 41.3 0.0 - 149.0 mg/dL   HDL 24.40 >10.27 mg/dL   VLDL 25.3 0.0 - 66.4 mg/dL   LDL Cholesterol 403 (H) 0 - 99 mg/dL   Total CHOL/HDL Ratio 4    NonHDL 140.67     Assessment & Plan:   Problem List Items Addressed This Visit     Health maintenance examination - Primary (Chronic)    Preventative protocols reviewed and updated unless pt declined. Discussed healthy diet and lifestyle.       Hereditary spherocytosis (HCC)    Adults who have already received both a PCV and PPSV23 vaccine require no further vaccination.  rec meningococcal vaccine Q5 yrs + menB series Q2-3 yrs - it  seems pt will need to get meningitis vaccine through pharmacy for medicare to cover this. He will check at pharmacy on this.       Psoriasis    Established with dermatologist      Erectile dysfunction    Stable period with PRN generic sildenafil.      Asplenia after surgical procedure   Dyslipidemia    Chronic, not on statin. Reviewed healthy diet and lifestyle choices to affect sustainable weight loss.  The 10-year ASCVD risk score (Arnett DK, et al., 2019) is: 18%   Values used to calculate the score:     Age: 31 years     Sex: Male     Is Non-Hispanic African American: No     Diabetic: No     Tobacco smoker: No     Systolic Blood Pressure: 122 mmHg     Is BP treated: No     HDL Cholesterol: 45.4 mg/dL     Total Cholesterol: 186 mg/dL       Middle ear effusion    Symptoms present for 1 month since driving up to [facility].  Exam with  serous otitis.  Discussed flonase use.  Update if ongoing symptoms past 4-6 wks for ENT referral/eval.        No orders of the defined types were placed in this encounter.   No orders of the defined types were placed in this encounter.   Patient Instructions  If interested, check with pharmacy about new 2 shot shingles series (shingrix).  Meningococcal vaccine through local pharmacy  You have fluid in right middle ear - start flonase nasal steroid over the counter 2 squirts into each nostril daily for next few weeks. Let us know if no better over next 4-6 weeks for referral to ENT.  Return as needed or in 1 year for next physical/wellness visit   Follow up plan: Return in about 1 year (around 02/03/2024) for annual exam, prior fasting for blood work, medicare wellness visit.  Eustaquio Boyden, MD

## 2023-02-03 NOTE — Assessment & Plan Note (Signed)
Established with dermatologist

## 2023-02-03 NOTE — Assessment & Plan Note (Signed)
Stable period with PRN generic sildenafil.

## 2023-02-11 DIAGNOSIS — H6501 Acute serous otitis media, right ear: Secondary | ICD-10-CM | POA: Diagnosis not present

## 2023-03-02 ENCOUNTER — Ambulatory Visit (INDEPENDENT_AMBULATORY_CARE_PROVIDER_SITE_OTHER): Payer: Medicare HMO

## 2023-03-02 VITALS — Ht 66.25 in | Wt 168.0 lb

## 2023-03-02 DIAGNOSIS — Z0000 Encounter for general adult medical examination without abnormal findings: Secondary | ICD-10-CM | POA: Diagnosis not present

## 2023-03-02 NOTE — Progress Notes (Signed)
Subjective:   Dustin Cole is a 72 y.o. male who presents for Medicare Annual/Subsequent preventive examination.  Visit Complete: Virtual I connected with  Dustin Cole on 03/02/23 by a audio enabled telemedicine application and verified that I am speaking with the correct person using two identifiers.  Patient Location: Home  Provider Location: Office/Clinic  I discussed the limitations of evaluation and management by telemedicine. The patient expressed understanding and agreed to proceed.  Vital Signs: Because this visit was a virtual/telehealth visit, some criteria may be missing or patient reported. Any vitals not documented were not able to be obtained and vitals that have been documented are patient reported.  Patient Medicare AWV questionnaire was completed by the patient on (not done); I have confirmed that all information answered by patient is correct and no changes since this date. Cardiac Risk Factors include: advanced age (>23men, >16 women);dyslipidemia;male gender    Objective:    Today's Vitals   03/02/23 1445  Weight: 168 lb (76.2 kg)  Height: 5' 6.25" (1.683 m)   Body mass index is 26.91 kg/m.     03/02/2023    3:00 PM 02/19/2022    9:46 AM 12/23/2018    9:48 AM 12/10/2017    9:30 AM  Advanced Directives  Does Patient Have a Medical Advance Directive? Yes No Yes Yes  Type of Advance Directive Living will;Healthcare Power of [employer] Power of [place];Living will Healthcare Power of [place];Living will  Copy of Healthcare Power of Attorney in Chart? No - copy requested  No - copy requested No - copy requested  Would patient like information on creating a medical advance directive?  No - Patient declined      Current Medications (verified) Outpatient Encounter Medications as of 03/02/2023  Medication Sig   aspirin 81 MG tablet Take 81 mg by mouth daily.   Multiple Vitamin (MULTIVITAMIN) tablet Take 1 tablet by mouth daily.   Multiple  Vitamins-Minerals (PRESERVISION AREDS PO) Take by mouth daily.   sildenafil (REVATIO) 20 MG tablet TAKE TWO TO FIVE TABLETS BY MOUTH ONCE DAILY AS NEEDED FOR RELATIONS   No facility-administered encounter medications on file as of 03/02/2023.    Allergies (verified) Patient has no known allergies.   History: Past Medical History:  Diagnosis Date   Hereditary spherocytosis (HCC)    s/p splenectomy as teen   History of chicken pox    Past Surgical History:  Procedure Laterality Date   COLONOSCOPY  2007   WNL per prior PCP records   COLONOSCOPY  06/2021   fair colon prep, TA, SSPx2, diverticulosis, rpt 3 yrs with 2d prep Deboraha Sprang Dr Marca Ancona)   SPLENECTOMY  1970s   congenital spherocytosis   Family History  Problem Relation Age of Onset   Cancer Mother        breast   Clotting disorder Brother        ?hereditary spherocytosis   Cancer Father 62       prostate   CAD Father 78       CABG   Stroke Neg Hx    Diabetes Neg Hx    Hypertension Neg Hx    Social History   Socioeconomic History   Marital status: Married    Spouse name: Not on file   Number of children: Not on file   Years of education: Not on file   Highest education level: Not on file  Occupational History   Not on file  Tobacco Use  Smoking status: Never   Smokeless tobacco: Never  Vaping Use   Vaping status: Never Used  Substance and Sexual Activity   Alcohol use: No    Alcohol/week: 0.0 standard drinks of alcohol   Drug use: No   Sexual activity: Yes  Other Topics Concern   Not on file  Social History Narrative   Lives with wife, dogs   Occupation: Marketing executive    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   Diet: good water, fruit/svegetables daily, some chicken and fish   Social Determinants of Corporate investment banker Strain: Low Risk  (03/02/2023)   Overall Financial Resource Strain (CARDIA)    Difficulty of Paying Living Expenses: Not hard at all  Food Insecurity: No Food Insecurity  (03/02/2023)   Hunger Vital Sign    Worried About Running Out of Food in the Last Year: Never true    Ran Out of Food in the Last Year: Never true  Transportation Needs: No Transportation Needs (03/02/2023)   PRAPARE - Administrator, Civil Service (Medical): No    Lack of Transportation (Non-Medical): No  Physical Activity: Sufficiently Active (03/02/2023)   Exercise Vital Sign    Days of Exercise per Week: 5 days    Minutes of Exercise per Session: 40 min  Stress: No Stress Concern Present (03/02/2023)   [employer] of Occupational Health - Occupational Stress Questionnaire    Feeling of Stress : Not at all  Social Connections: Moderately Integrated (03/02/2023)   Social Connection and Isolation Panel [NHANES]    Frequency of Communication with Friends and Family: More than three times a week    Frequency of Social Gatherings with Friends and Family: More than three times a week    Attends Religious Services: More than 4 times per year    Active Member of [employer] or Organizations: No    Attends Engineer, structural: Never    Marital Status: Married    Tobacco Counseling Counseling given: Not Answered   Clinical Intake:  Pre-visit preparation completed: Yes  Pain : No/denies pain    BMI - recorded: 26.91 Nutritional Status: BMI 25 -29 Overweight Nutritional Risks: None Diabetes: No  How often do you need to have someone help you when you read instructions, pamphlets, or other written materials from your doctor or pharmacy?: 1 - Never  Interpreter Needed?: No  Comments: lives with wife Information entered by :: B.Angelle Isais,LPN   Activities of Daily Living    03/02/2023    3:01 PM  In your present state of health, do you have any difficulty performing the following activities:  Hearing? 1  Vision? 0  Difficulty concentrating or making decisions? 0  Walking or climbing stairs? 0  Dressing or bathing? 0  Doing errands, shopping? 0   Preparing Food and eating ? N  Using the Toilet? N  In the past six months, have you accidently leaked urine? N  Do you have problems with loss of bowel control? N  Managing your Medications? N  Managing your Finances? N  Housekeeping or managing your Housekeeping? N    Patient Care Team: Eustaquio Boyden, MD as PCP - General (Family Medicine)  Indicate any recent Medical Services you may have received from other than [facility] providers in the past year (date may be approximate).     Assessment:   This is a routine wellness examination for Khyrie.  Hearing/Vision screen Hearing Screening - Comments:: Pt says his hearing is good  w/hearing aids Vision Screening - Comments:: Pt says his vision is good w/glasses Sept eye appt w/Dr Alvester Morin   Goals Addressed             This Visit's Progress    COMPLETED: DIET - EAT MORE FRUITS AND VEGETABLES   On track    COMPLETED: DIET - INCREASE WATER INTAKE   On track    Starting 12/10/2017, I will continue to drink at least 6-8 glasses of water daily.       Depression Screen    03/02/2023    2:53 PM 02/03/2023   12:26 PM 02/19/2022    9:44 AM 01/10/2021    7:57 AM 01/05/2020    7:22 AM 12/23/2018    9:48 AM 12/10/2017    9:30 AM  PHQ 2/9 Scores  PHQ - 2 Score 0 0 0 0 0 0 0  PHQ- 9 Score   0   0 0    Fall Risk    03/02/2023    2:49 PM 02/03/2023   12:26 PM 02/19/2022    9:46 AM 01/10/2021    7:57 AM 12/23/2018    9:48 AM  Fall Risk   Falls in the past year? 0 0 0 0 0  Number falls in past yr: 0  0    Injury with Fall? 0  0    Risk for fall due to : No Fall Risks  No Fall Risks    Follow up Education provided;Falls prevention discussed  Falls prevention discussed;Falls evaluation completed  Falls evaluation completed;Falls prevention discussed    MEDICARE RISK AT HOME: Medicare Risk at Home Any stairs in or around the home?: Yes If so, are there any without handrails?: Yes Home free of loose throw rugs in walkways, pet beds,  electrical cords, etc?: Yes Adequate lighting in your home to reduce risk of falls?: Yes Life alert?: No Use of a cane, walker or w/c?: No Grab bars in the bathroom?: No Shower chair or bench in shower?: No Elevated toilet seat or a handicapped toilet?: No  TIMED UP AND GO:  Was the test performed?  No    Cognitive Function:    12/23/2018    9:51 AM 12/10/2017    9:30 AM  MMSE - Mini Mental State Exam  Orientation to time 5 5  Orientation to Place 5 5  Registration 3 3  Attention/ Calculation 5 0  Recall 3 3  Language- name 2 objects 0 0  Language- repeat 1 1  Language- follow 3 step command 0 3  Language- read & follow direction 0 0  Write a sentence 0 0  Copy design 0 0  Total score 22 20        03/02/2023    3:02 PM 02/19/2022    9:47 AM  6CIT Screen  What Year? 0 points 0 points  What month? 0 points 0 points  What time? 0 points 0 points  Count back from 20 0 points 0 points  Months in reverse 0 points 0 points  Repeat phrase 0 points 0 points  Total Score 0 points 0 points    Immunizations Immunization History  Administered Date(s) Administered   Fluad Quad(high Dose 65+) 12/30/2018   Fluzone Influenza virus vaccine,trivalent (IIV3), split virus 03/19/2011, 01/22/2012   Influenza,inj,Quad PF,6+ Mos 01/19/2017, 12/17/2017   Influenza-Unspecified 01/15/2014, 01/09/2015, 01/12/2016   PPD Test 07/27/2014, 07/23/2015, 09/15/2016, 09/29/2017, 11/09/2018, 09/26/2019, 09/17/2020   Pneumococcal Conjugate-13 12/04/2016   Pneumococcal Polysaccharide-23 12/21/2007, 12/22/2013  Td (Adult) 04/20/2003   Tdap 12/22/2013   Zoster, Live 12/16/2012    TDAP status: Up to date  Flu Vaccine status: Due, Education has been provided regarding the importance of this vaccine. Advised may receive this vaccine at local pharmacy or Health Dept. Aware to provide a copy of the vaccination record if obtained from local pharmacy or Health Dept. Verbalized acceptance and  understanding.  Pneumococcal vaccine status: Up to date  Covid-19 vaccine status: Completed vaccines  Qualifies for Shingles Vaccine? Yes   Zostavax completed No   Shingrix Completed?: No.    Education has been provided regarding the importance of this vaccine. Patient has been advised to call insurance company to determine out of pocket expense if they have not yet received this vaccine. Advised may also receive vaccine at local pharmacy or Health Dept. Verbalized acceptance and understanding.  Screening Tests Health Maintenance  Topic Date Due   Meningococcal B Vaccine (1 of 4 - Increased Risk) Never done   Zoster Vaccines- Shingrix (1 of 2) 10/20/2001   Pneumonia Vaccine 10+ Years old (3 of 3 - PPSV23 or PCV20) 12/04/2021   COVID-19 Vaccine (1 - 2023-24 season) Never done   INFLUENZA VACCINE  07/12/2023 (Originally 11/12/2022)   DTaP/Tdap/Td (2 - Td or Tdap) 12/23/2023   Fecal DNA (Cologuard)  01/18/2024   Medicare Annual Wellness (AWV)  03/01/2024   Hepatitis C Screening  Completed   HPV VACCINES  Aged Out    Health Maintenance  Health Maintenance Due  Topic Date Due   Meningococcal B Vaccine (1 of 4 - Increased Risk) Never done   Zoster Vaccines- Shingrix (1 of 2) 10/20/2001   Pneumonia Vaccine 77+ Years old (3 of 3 - PPSV23 or PCV20) 12/04/2021   COVID-19 Vaccine (1 - 2023-24 season) Never done    Colorectal cancer screening: Type of screening: Cologuard. Completed 01/17/2021. Repeat every 3 years  Lung Cancer Screening: (Low Dose CT Chest recommended if Age 101-80 years, 20 pack-year currently smoking OR have quit w/in 15years.) does not qualify.   Lung Cancer Screening Referral: no  Additional Screening:  Hepatitis C Screening: does not qualify; Completed 04/24/2016  Vision Screening: Recommended annual ophthalmology exams for early detection of glaucoma and other disorders of the eye. Is the patient up to date with their annual eye exam?  Yes  Who is the provider or  what is the name of the office in which the patient attends annual eye exams? Dr Alvester Morin If pt is not established with a provider, would they like to be referred to a provider to establish care? No .   Dental Screening: Recommended annual dental exams for proper oral hygiene  Diabetic Foot Exam: n/a  Community Resource Referral / Chronic Care Management: CRR required this visit?  No   CCM required this visit?  No     Plan:     I have personally reviewed and noted the following in the patient's chart:   Medical and social history Use of alcohol, tobacco or illicit drugs  Current medications and supplements including opioid prescriptions. Patient is not currently taking opioid prescriptions. Functional ability and status Nutritional status Physical activity Advanced directives List of other physicians Hospitalizations, surgeries, and ER visits in previous 12 months Vitals Screenings to include cognitive, depression, and falls Referrals and appointments  In addition, I have reviewed and discussed with patient certain preventive protocols, quality metrics, and best practice recommendations. A written personalized care plan for preventive services as well as general preventive  health recommendations were provided to patient.    Sue Lush, LPN   [id number]   After Visit Summary: (Declined) Due to this being a telephonic visit, with patients personalized plan was offered to patient but patient Declined AVS at this time   Nurse Notes: The patient states he is doing well and has no concerns or questions at this time.

## 2023-03-02 NOTE — Patient Instructions (Signed)
Dustin Cole , Thank you for taking time to come for your Medicare Wellness Visit. I appreciate your ongoing commitment to your health goals. Please review the following plan we discussed and let me know if I can assist you in the future.   Referrals/Orders/Follow-Ups/Clinician Recommendations: none  This is a list of the screening recommended for you and due dates:  Health Maintenance  Topic Date Due   Meningitis B Vaccine (1 of 4 - Increased Risk) Never done   Zoster (Shingles) Vaccine (1 of 2) 10/20/2001   Pneumonia Vaccine (3 of 3 - PPSV23 or PCV20) 12/04/2021   COVID-19 Vaccine (1 - 2023-24 season) Never done   Flu Shot  07/12/2023*   DTaP/Tdap/Td vaccine (2 - Td or Tdap) 12/23/2023   Cologuard (Stool DNA test)  01/18/2024   Medicare Annual Wellness Visit  03/01/2024   Hepatitis C Screening  Completed   HPV Vaccine  Aged Out  *Topic was postponed. The date shown is not the original due date.    Advanced directives: (Copy Requested) Please bring a copy of your health care power of attorney and living will to the office to be added to your chart at your convenience.  Next Medicare Annual Wellness Visit scheduled for next year: Yes 03/02/24 @ 3pm televisit

## 2023-03-04 DIAGNOSIS — H6501 Acute serous otitis media, right ear: Secondary | ICD-10-CM | POA: Diagnosis not present

## 2023-03-31 ENCOUNTER — Other Ambulatory Visit: Payer: Self-pay | Admitting: Family Medicine

## 2023-03-31 DIAGNOSIS — N529 Male erectile dysfunction, unspecified: Secondary | ICD-10-CM

## 2023-03-31 NOTE — Telephone Encounter (Signed)
Sildenafil Last filled:  02/13/23, #30 Last OV:  02/03/23, CPE Next OV:  02/29/24, CPE

## 2023-05-27 DIAGNOSIS — H903 Sensorineural hearing loss, bilateral: Secondary | ICD-10-CM | POA: Diagnosis not present

## 2023-06-17 DIAGNOSIS — H353131 Nonexudative age-related macular degeneration, bilateral, early dry stage: Secondary | ICD-10-CM | POA: Diagnosis not present

## 2023-06-24 ENCOUNTER — Other Ambulatory Visit: Payer: Self-pay | Admitting: Family Medicine

## 2023-06-24 DIAGNOSIS — N529 Male erectile dysfunction, unspecified: Secondary | ICD-10-CM

## 2023-08-11 ENCOUNTER — Ambulatory Visit
Admission: RE | Admit: 2023-08-11 | Discharge: 2023-08-11 | Disposition: A | Discharge status: Home / Self Care | Source: Ambulatory Visit | Attending: Family Medicine | Admitting: Family Medicine

## 2023-08-11 ENCOUNTER — Ambulatory Visit (INDEPENDENT_AMBULATORY_CARE_PROVIDER_SITE_OTHER): Admitting: Family Medicine

## 2023-08-11 ENCOUNTER — Encounter: Payer: Self-pay | Admitting: Family Medicine

## 2023-08-11 VITALS — BP 120/72 | HR 100 | Temp 97.9°F | Ht 66.25 in | Wt 171.1 lb

## 2023-08-11 DIAGNOSIS — Z9081 Acquired absence of spleen: Secondary | ICD-10-CM

## 2023-08-11 DIAGNOSIS — J189 Pneumonia, unspecified organism: Secondary | ICD-10-CM | POA: Diagnosis not present

## 2023-08-11 DIAGNOSIS — R52 Pain, unspecified: Secondary | ICD-10-CM | POA: Diagnosis not present

## 2023-08-11 DIAGNOSIS — R051 Acute cough: Secondary | ICD-10-CM

## 2023-08-11 DIAGNOSIS — R059 Cough, unspecified: Secondary | ICD-10-CM | POA: Diagnosis not present

## 2023-08-11 HISTORY — DX: Pneumonia, unspecified organism: J18.9

## 2023-08-11 LAB — POCT INFLUENZA A/B
Influenza A, POC: NEGATIVE
Influenza B, POC: NEGATIVE

## 2023-08-11 LAB — POC COVID19 BINAXNOW: SARS Coronavirus 2 Ag: NEGATIVE

## 2023-08-11 MED ORDER — AZITHROMYCIN 250 MG PO TABS: ORAL_TABLET | ORAL | 0 refills | Status: DC

## 2023-08-11 MED ORDER — AMOXICILLIN-POT CLAVULANATE 875-125 MG PO TABS: 1.0000 | ORAL_TABLET | Freq: Two times a day (BID) | ORAL | 0 refills | Status: AC

## 2023-08-11 NOTE — Patient Instructions (Addendum)
 Concern for lung infection - possible pneumonia.  Take azithromycin  + augmentin  antibiotics sent to pharmacy.  Push fluids and rest. Let us  know if not improving with treatment.  We can discuss updating pneumonia shot at your next visit.

## 2023-08-11 NOTE — Progress Notes (Signed)
 Ph: [phone] Fax: [phone]   Patient ID: Dustin Cole, male    DOB: 1950/09/09, 73 y.o.   MRN: [id number]  This visit was conducted in person.  BP 120/72   Pulse 100   Temp 97.9 F (36.6 C) (Oral)   Ht 5' 6.25" (1.683 m)   Wt 171 lb 2 oz (77.6 kg)   SpO2 93%   BMI 27.41 kg/m    CC: cough, congestion  Subjective:   HPI: Dustin Cole is a 73 y.o. male presenting on 08/11/2023 for Cough (C/o cough- worse at night, chills and body aches. Sxs started 08/08/23. )   3d h/o cough overall dry but rattling, chest congestion, body aches. Some chills initially. Has had trouble sleeping due to cough.   No fevers, ear or tooth pain, headache, abd pain, nausea, diarrhea.   H/o splenectomy as teen for hereditary spherocytosis.   No h/o asthma, no smoking history.   Treating with nyquil with limited benefit, rocking rye.  Wife recently sick at home - now feeling better.   Wearing hearing aides for the past year.      Relevant past medical, surgical, family and social history reviewed and updated as indicated. Interim medical history since our last visit reviewed. Allergies and medications reviewed and updated. Outpatient Medications Prior to Visit  Medication Sig Dispense Refill   aspirin 81 MG tablet Take 81 mg by mouth daily.     Multiple Vitamin (MULTIVITAMIN) tablet Take 1 tablet by mouth daily.     Multiple Vitamins-Minerals (PRESERVISION AREDS PO) Take by mouth daily.     sildenafil  (REVATIO ) 20 MG tablet TAKE TWO TO FIVE TABLETS BY MOUTH ONCE DAILY AS NEEDED FOR RELATIONS 30 tablet 0   No facility-administered medications prior to visit.     Per HPI unless specifically indicated in ROS section below Review of Systems  Objective:  BP 120/72   Pulse 100   Temp 97.9 F (36.6 C) (Oral)   Ht 5' 6.25" (1.683 m)   Wt 171 lb 2 oz (77.6 kg)   SpO2 93%   BMI 27.41 kg/m   Wt Readings from Last 3 Encounters:  08/11/23 171 lb 2 oz (77.6 kg)  03/02/23 168 lb  (76.2 kg)  02/03/23 168 lb 6 oz (76.4 kg)      Physical Exam Vitals and nursing note reviewed.  Constitutional:      Appearance: Normal appearance. He is not ill-appearing.  HENT:     Head: Normocephalic and atraumatic.     Right Ear: Hearing, tympanic membrane, ear canal and external ear normal. There is no impacted cerumen.     Left Ear: Hearing, tympanic membrane, ear canal and external ear normal. There is no impacted cerumen.     Nose: No mucosal edema.     Right Turbinates: Not enlarged or swollen.     Left Turbinates: Not enlarged or swollen.     Right Sinus: No maxillary sinus tenderness or frontal sinus tenderness.     Left Sinus: No maxillary sinus tenderness or frontal sinus tenderness.     Comments: Wearing mask    Mouth/Throat:     Mouth: Mucous membranes are moist.     Pharynx: Oropharynx is clear. No oropharyngeal exudate or posterior oropharyngeal erythema.  Eyes:     Extraocular Movements: Extraocular movements intact.     Conjunctiva/sclera: Conjunctivae normal.     Pupils: Pupils are equal, round, and reactive to light.  Cardiovascular:     Rate and  Rhythm: Normal rate and regular rhythm.     Pulses: Normal pulses.     Heart sounds: Normal heart sounds. No murmur heard. Pulmonary:     Effort: Pulmonary effort is normal. No respiratory distress.     Breath sounds: Rhonchi (diffusely R lung fields) and rales (R lung) present. No wheezing.  Musculoskeletal:     Cervical back: Normal range of motion and neck supple. No rigidity.     Right lower leg: No edema.     Left lower leg: No edema.  Lymphadenopathy:     Cervical: No cervical adenopathy.  Skin:    General: Skin is warm and dry.     Findings: No rash.  Neurological:     Mental Status: He is alert.  Psychiatric:        Mood and Affect: Mood normal.        Behavior: Behavior normal.       Results for orders placed or performed in visit on 08/11/23  POC COVID-19 BinaxNow   Collection Time: 08/11/23  12:17 PM  Result Value Ref Range   SARS Coronavirus 2 Ag Negative Negative  POCT Influenza A/B   Collection Time: 08/11/23 12:17 PM  Result Value Ref Range   Influenza A, POC Negative Negative   Influenza B, POC Negative Negative    Assessment & Plan:   Problem List Items Addressed This Visit     Asplenia after surgical procedure   CAP (community acquired pneumonia) - Primary   Several days of cough, chest congestion, and new relative hypoxia to low 90's.  S/p splenectomy increases risk of infection.  Check CXR xray given abnormal R lung sounds - haziness to R lower lobe concerning for pneumonia. Rx azithromycin  + augmentin  course to cover community acquired pneumonia. Will recommend updated prevnar-20 when feeling better.       Relevant Medications   azithromycin  (ZITHROMAX ) 250 MG tablet   amoxicillin -clavulanate (AUGMENTIN ) 875-125 MG tablet   Other Visit Diagnoses       Acute cough       Relevant Orders   POC COVID-19 BinaxNow (Completed)   DG Chest 2 View     Body aches       Relevant Orders   POCT Influenza A/B (Completed)        Meds ordered this encounter  Medications   azithromycin  (ZITHROMAX ) 250 MG tablet    Sig: Take two tablets on day one followed by one tablet on days 2-5    Dispense:  6 each    Refill:  0   amoxicillin -clavulanate (AUGMENTIN ) 875-125 MG tablet    Sig: Take 1 tablet by mouth 2 (two) times daily for 10 days.    Dispense:  20 tablet    Refill:  0    Orders Placed This Encounter  Procedures   DG Chest 2 View    Standing Status:   Future    Number of Occurrences:   1    Expiration Date:   08/10/2024    Reason for Exam (SYMPTOM  OR DIAGNOSIS REQUIRED):   cough, abnormal R lung exam    Preferred imaging location?:   [facility]   POC COVID-19 BinaxNow    Previously tested for COVID-19:   Yes    Resident in a congregate (group) care setting:   No    Employed in healthcare setting:   No   POCT Influenza A/B    Patient  Instructions  Concern for lung infection - possible pneumonia.  Take azithromycin  + augmentin  antibiotics sent to pharmacy.  Push fluids and rest. Let us  know if not improving with treatment.  We can discuss updating pneumonia shot at your next visit.   Follow up plan: Return if symptoms worsen or fail to improve.  Claire Crick, MD

## 2023-08-11 NOTE — Assessment & Plan Note (Signed)
 Several days of cough, chest congestion, and new relative hypoxia to low 90's.  S/p splenectomy increases risk of infection.  Check CXR xray given abnormal R lung sounds - haziness to R lower lobe concerning for pneumonia. Rx azithromycin  + augmentin  course to cover community acquired pneumonia. Will recommend updated prevnar-20 when feeling better.

## 2023-09-23 DIAGNOSIS — H43812 Vitreous degeneration, left eye: Secondary | ICD-10-CM | POA: Diagnosis not present

## 2023-10-14 DIAGNOSIS — H353131 Nonexudative age-related macular degeneration, bilateral, early dry stage: Secondary | ICD-10-CM | POA: Diagnosis not present

## 2023-10-28 ENCOUNTER — Other Ambulatory Visit: Payer: Self-pay | Admitting: Family Medicine

## 2023-10-28 DIAGNOSIS — N529 Male erectile dysfunction, unspecified: Secondary | ICD-10-CM

## 2023-10-29 ENCOUNTER — Telehealth: Payer: Self-pay

## 2023-10-29 ENCOUNTER — Other Ambulatory Visit (HOSPITAL_COMMUNITY): Payer: Self-pay

## 2023-10-29 NOTE — Telephone Encounter (Signed)
 Pt stated at 08/11/23 OV he had stopped med.  Spoke with pt relaying info above and asked if he has restarted med. Pt confirms he restarted med.   E-scribed refill.

## 2023-10-29 NOTE — Telephone Encounter (Signed)
 Pharmacy Patient Advocate Encounter  Received notification from [facility] that Prior Authorization for Sildenafil  20 has been DENIED.  Full denial letter will be uploaded to the media tab. See denial reason below.   PA #/Case ID/Reference #: [id number]

## 2023-10-29 NOTE — Telephone Encounter (Signed)
 Pharmacy Patient Advocate Encounter   Received notification from Onbase that prior authorization for Sildenafil  20 is required/requested.   Insurance verification completed.   The patient is insured through [facility] .   Per test claim: PA required; PA submitted to above mentioned insurance via CoverMyMeds Key/confirmation #/EOC [id number] Status is pending

## 2023-11-01 NOTE — Telephone Encounter (Addendum)
 Sildenafil  20mg  dose is never covered by insurance for ED.  Therefore doesn't need PA process run for this.  I talk with patients about this when prescribing. He will pay out of pocket if desires to take this.

## 2023-11-01 NOTE — Telephone Encounter (Signed)
 Noted

## 2023-12-14 ENCOUNTER — Telehealth: Payer: Self-pay | Admitting: Family Medicine

## 2023-12-14 NOTE — Telephone Encounter (Signed)
 Patient is going on a bus trip to [place] , Leaving on Saturday is asking if he can get a script for IVERMECTIN so they have it for the trip for both him and his wife.

## 2023-12-14 NOTE — Telephone Encounter (Signed)
 Does he mean azithromycin ? If not what is ivermectin for? I'm not familiar with prescribing this preventatively for anything.  Would hold off on antibiotics if not having any symptoms.

## 2023-12-15 ENCOUNTER — Other Ambulatory Visit (HOSPITAL_COMMUNITY): Payer: Self-pay

## 2023-12-15 NOTE — Telephone Encounter (Signed)
 Called patient to verify that it was ivermectin that he requested. Patient states that he seen on the news that it helps prevent Covid. Advised patient that is not something proven to help with treatment or prevention of Covid. That as far as my knowledge that is not a recommendation from our office. Advised I will send message to PCP to review and will call back if any additional information. He asked what he can do to prevent Covid. Advised on proper hand hygiene and use of mask. He will do as advised. Patient informed will only call if provider has any additional information or changes in recommendations.

## 2023-12-15 NOTE — Telephone Encounter (Signed)
 Noted. Agree.

## 2023-12-28 DIAGNOSIS — H353131 Nonexudative age-related macular degeneration, bilateral, early dry stage: Secondary | ICD-10-CM | POA: Diagnosis not present

## 2023-12-28 DIAGNOSIS — H33012 Retinal detachment with single break, left eye: Secondary | ICD-10-CM | POA: Diagnosis not present

## 2023-12-29 DIAGNOSIS — Z7982 Long term (current) use of aspirin: Secondary | ICD-10-CM | POA: Diagnosis not present

## 2023-12-29 DIAGNOSIS — H3322 Serous retinal detachment, left eye: Secondary | ICD-10-CM | POA: Diagnosis not present

## 2023-12-29 DIAGNOSIS — H33002 Unspecified retinal detachment with retinal break, left eye: Secondary | ICD-10-CM | POA: Diagnosis not present

## 2024-01-04 DIAGNOSIS — H532 Diplopia: Secondary | ICD-10-CM | POA: Diagnosis not present

## 2024-01-04 DIAGNOSIS — H5021 Vertical strabismus, right eye: Secondary | ICD-10-CM | POA: Diagnosis not present

## 2024-01-05 DIAGNOSIS — H518 Other specified disorders of binocular movement: Secondary | ICD-10-CM | POA: Diagnosis not present

## 2024-01-05 DIAGNOSIS — H5022 Vertical strabismus, left eye: Secondary | ICD-10-CM | POA: Diagnosis not present

## 2024-01-05 DIAGNOSIS — Z7982 Long term (current) use of aspirin: Secondary | ICD-10-CM | POA: Diagnosis not present

## 2024-01-05 DIAGNOSIS — H50632 Inferior rectus muscle entrapment, left eye: Secondary | ICD-10-CM | POA: Diagnosis not present

## 2024-01-05 DIAGNOSIS — H5021 Vertical strabismus, right eye: Secondary | ICD-10-CM | POA: Diagnosis not present

## 2024-01-05 DIAGNOSIS — Z9889 Other specified postprocedural states: Secondary | ICD-10-CM | POA: Diagnosis not present

## 2024-01-05 DIAGNOSIS — H5089 Other specified strabismus: Secondary | ICD-10-CM | POA: Diagnosis not present

## 2024-01-05 DIAGNOSIS — Z79899 Other long term (current) drug therapy: Secondary | ICD-10-CM | POA: Diagnosis not present

## 2024-01-05 DIAGNOSIS — H53452 Other localized visual field defect, left eye: Secondary | ICD-10-CM | POA: Diagnosis not present

## 2024-01-06 DIAGNOSIS — H5022 Vertical strabismus, left eye: Secondary | ICD-10-CM | POA: Diagnosis not present

## 2024-01-06 DIAGNOSIS — Z9889 Other specified postprocedural states: Secondary | ICD-10-CM | POA: Diagnosis not present

## 2024-01-07 DIAGNOSIS — Z9889 Other specified postprocedural states: Secondary | ICD-10-CM | POA: Diagnosis not present

## 2024-01-07 DIAGNOSIS — H33012 Retinal detachment with single break, left eye: Secondary | ICD-10-CM | POA: Diagnosis not present

## 2024-01-14 DIAGNOSIS — H33012 Retinal detachment with single break, left eye: Secondary | ICD-10-CM | POA: Diagnosis not present

## 2024-01-14 DIAGNOSIS — Z9889 Other specified postprocedural states: Secondary | ICD-10-CM | POA: Diagnosis not present

## 2024-02-04 DIAGNOSIS — H33012 Retinal detachment with single break, left eye: Secondary | ICD-10-CM | POA: Diagnosis not present

## 2024-02-04 DIAGNOSIS — Z9889 Other specified postprocedural states: Secondary | ICD-10-CM | POA: Diagnosis not present

## 2024-02-10 DIAGNOSIS — H5021 Vertical strabismus, right eye: Secondary | ICD-10-CM | POA: Diagnosis not present

## 2024-02-20 ENCOUNTER — Other Ambulatory Visit: Payer: Self-pay | Admitting: Family Medicine

## 2024-02-20 DIAGNOSIS — E785 Hyperlipidemia, unspecified: Secondary | ICD-10-CM

## 2024-02-20 DIAGNOSIS — D751 Secondary polycythemia: Secondary | ICD-10-CM

## 2024-02-20 DIAGNOSIS — Z125 Encounter for screening for malignant neoplasm of prostate: Secondary | ICD-10-CM

## 2024-02-22 ENCOUNTER — Other Ambulatory Visit: Payer: Medicare HMO

## 2024-02-22 ENCOUNTER — Other Ambulatory Visit

## 2024-02-22 DIAGNOSIS — Z125 Encounter for screening for malignant neoplasm of prostate: Secondary | ICD-10-CM

## 2024-02-22 DIAGNOSIS — E785 Hyperlipidemia, unspecified: Secondary | ICD-10-CM | POA: Diagnosis not present

## 2024-02-22 DIAGNOSIS — D751 Secondary polycythemia: Secondary | ICD-10-CM

## 2024-02-22 LAB — LIPID PANEL
Cholesterol: 212 mg/dL — ABNORMAL HIGH (ref 0–200)
HDL: 41.7 mg/dL (ref >39.00)
LDL Cholesterol: 147 mg/dL — ABNORMAL HIGH (ref 0–99)
NonHDL: 169.9
Total CHOL/HDL Ratio: 5
Triglycerides: 115 mg/dL (ref 0.0–149.0)
VLDL: 23 mg/dL (ref 0.0–40.0)

## 2024-02-22 LAB — COMPREHENSIVE METABOLIC PANEL WITH GFR
ALT: 26 U/L (ref 0–53)
AST: 22 U/L (ref 0–37)
Albumin: 4.2 g/dL (ref 3.5–5.2)
Alkaline Phosphatase: 78 U/L (ref 39–117)
BUN: 9 mg/dL (ref 6–23)
CO2: 30 meq/L (ref 19–32)
Calcium: 8.8 mg/dL (ref 8.4–10.5)
Chloride: 101 meq/L (ref 96–112)
Creatinine, Ser: 0.91 mg/dL (ref 0.40–1.50)
GFR: 84.3 mL/min (ref >60.00)
Glucose, Bld: 85 mg/dL (ref 70–99)
Potassium: 4 meq/L (ref 3.5–5.1)
Sodium: 139 meq/L (ref 135–145)
Total Bilirubin: 1.5 mg/dL — ABNORMAL HIGH (ref 0.2–1.2)
Total Protein: 6.3 g/dL (ref 6.0–8.3)

## 2024-02-22 LAB — CBC WITH DIFFERENTIAL/PLATELET
Absolute Lymphocytes: 1873 {cells}/uL (ref 850–3900)
Absolute Monocytes: 865 {cells}/uL (ref 200–950)
Basophils Absolute: 84 {cells}/uL (ref 0–200)
Basophils Relative: 1 %
Eosinophils Absolute: 210 {cells}/uL (ref 15–500)
Eosinophils Relative: 2.5 %
HCT: 48.3 % (ref 38.5–50.0)
Hemoglobin: 16.8 g/dL (ref 13.2–17.1)
MCH: 30.8 pg (ref 27.0–33.0)
MCHC: 34.8 g/dL (ref 32.0–36.0)
MCV: 88.5 fL (ref 80.0–100.0)
MPV: 10.1 fL (ref 7.5–12.5)
Monocytes Relative: 10.3 %
Neutro Abs: 5368 {cells}/uL (ref 1500–7800)
Neutrophils Relative %: 63.9 %
Platelets: 396 Thousand/uL (ref 140–400)
RBC: 5.46 Million/uL (ref 4.20–5.80)
RDW: 13 % (ref 11.0–15.0)
Total Lymphocyte: 22.3 %
WBC: 8.4 Thousand/uL (ref 3.8–10.8)

## 2024-02-22 LAB — PSA: PSA: 2.16 ng/mL (ref 0.10–4.00)

## 2024-02-24 ENCOUNTER — Ambulatory Visit: Payer: Self-pay | Admitting: Family Medicine

## 2024-02-29 ENCOUNTER — Encounter: Payer: Self-pay | Admitting: Family Medicine

## 2024-02-29 ENCOUNTER — Ambulatory Visit: Payer: Medicare HMO | Admitting: Family Medicine

## 2024-02-29 VITALS — BP 122/70 | HR 103 | Temp 97.7°F | Ht 68.0 in | Wt 175.5 lb

## 2024-02-29 DIAGNOSIS — Z9081 Acquired absence of spleen: Secondary | ICD-10-CM

## 2024-02-29 DIAGNOSIS — D580 Hereditary spherocytosis: Secondary | ICD-10-CM | POA: Diagnosis not present

## 2024-02-29 DIAGNOSIS — Z23 Encounter for immunization: Secondary | ICD-10-CM | POA: Diagnosis not present

## 2024-02-29 DIAGNOSIS — Z0000 Encounter for general adult medical examination without abnormal findings: Secondary | ICD-10-CM | POA: Diagnosis not present

## 2024-02-29 DIAGNOSIS — E785 Hyperlipidemia, unspecified: Secondary | ICD-10-CM

## 2024-02-29 DIAGNOSIS — Z7189 Other specified counseling: Secondary | ICD-10-CM

## 2024-02-29 NOTE — Assessment & Plan Note (Signed)
 CBC WNL

## 2024-02-29 NOTE — Assessment & Plan Note (Signed)
 Preventative protocols reviewed and updated unless pt declined. Discussed healthy diet and lifestyle.

## 2024-02-29 NOTE — Progress Notes (Signed)
 Ph: [phone] Fax: [phone]   Patient ID: Dustin Cole Saba, male    DOB: 1950-10-07, 73 y.o.   MRN: [id number]  This visit was conducted in person.  BP 122/70   Pulse (!) 103   Temp 97.7 F (36.5 C) (Oral)   Ht 5' 8 (1.727 m)   Wt 175 lb 8 oz (79.6 kg)   SpO2 94%   BMI 26.68 kg/m    CC: CPE Subjective:   HPI: Dustin Cole is a 73 y.o. male presenting on 02/29/2024 for Annual Exam   Did not see health advisor this year. Has upcoming appt on Wednesday.   No results found.  Flowsheet Row Office Visit from 02/29/2024 in [facility] [place]  PHQ-2 Total Score 0       02/29/2024    9:10 AM 08/11/2023   12:03 PM 03/02/2023    2:49 PM 02/03/2023   12:26 PM 02/19/2022    9:46 AM  Fall Risk   Falls in the past year? 0 0 0 0 0  Number falls in past yr: 0  0  0  Injury with Fall? 0  0  0  Risk for fall due to : No Fall Risks  No Fall Risks  No Fall Risks  Follow up Falls evaluation completed  Education provided;Falls prevention discussed  Falls prevention discussed;Falls evaluation completed      Data saved with a previous flowsheet row definition    Has been seeing eye doctor - L eye retinal detachment s/p L eye vitrectomy 12/2023 followed by strabismus surgery (Dr Homer). Currently has oil bubble in left eye.   Care Team:  GI - Dr Saintclair Ee PCP - [facility]  Preventative: COLONOSCOPY 06/2021 - fair colon prep, TA, SSPx2, diverticulosis, rpt 3 yrs with 2d prep Gwen Dr Saintclair) - upcoming due  Prostate cancer screening - yearly.  Lung cancer screening - not eligible  Flu shot yearly - declines this year  COVID vaccine - discussed, declines  Pneumovax 12/2013. Prevnar-13 2018. Prevnar-20 - today  Meningitis vaccine - to get q5 yrs through local pharmacy  Tdap 12/2013 - to check with pharmacy  Zostavax with [facility] 12/2012  Shingrix - discussed  Advanced directive: scanned in chart 12/2017. Wife Tracer Gutridge then niece Kaige Whistler are HCPOA. Does not want life prolonging measures if terminally ill.  Seat belt use discussed  Sunscreen use discussed, denies changing moles.  Sleep - averages 7-8 hours/night Non smoker  Alcohol - none  Dentist Q6 mo LTR  Eye exam - yearly - more recently  Bowel - no constipation  Bladder - no incontinence   Caffeine: 3 coffee daily Lives with wife, dogs  Occupation: tile - semi-retired  Activity: active labor, active around the house, cuts wood, exercise bike and walking 1.5 miles daily Diet: good water, fruit/svegetables daily, some chicken and fish      Relevant past medical, surgical, family and social history reviewed and updated as indicated. Interim medical history since our last visit reviewed. Allergies and medications reviewed and updated. Outpatient Medications Prior to Visit  Medication Sig Dispense Refill   ascorbic acid (VITAMIN C) 1000 MG tablet 1 tablet Orally Once a day; Duration: 30 day(s)     aspirin 81 MG tablet Take 81 mg by mouth daily.     Multiple Vitamin (MULTIVITAMIN) tablet Take 1 tablet by mouth daily.     Multiple Vitamins-Minerals (PRESERVISION AREDS PO) Take by mouth daily.  sildenafil  (REVATIO ) 20 MG tablet TAKE TWO TO FIVE TABLETS BY MOUTH ONCE DAILY AS NEEDED FOR RELATIONS 30 tablet 2   azithromycin  (ZITHROMAX ) 250 MG tablet Take two tablets on day one followed by one tablet on days 2-5 6 each 0   No facility-administered medications prior to visit.     Per HPI unless specifically indicated in ROS section below Review of Systems  Constitutional:  Negative for activity change, appetite change, chills, fatigue, fever and unexpected weight change.  HENT:  Negative for hearing loss.   Eyes:  Negative for visual disturbance.  Respiratory:  Negative for cough, chest tightness, shortness of breath and wheezing.   Cardiovascular:  Negative for chest pain, palpitations and leg swelling.  Gastrointestinal:  Negative for abdominal distention,  abdominal pain, blood in stool, constipation, diarrhea, nausea and vomiting.  Genitourinary:  Negative for difficulty urinating and hematuria.  Musculoskeletal:  Negative for arthralgias, myalgias and neck pain.  Skin:  Negative for rash.  Neurological:  Negative for dizziness, seizures, syncope and headaches.  Hematological:  Negative for adenopathy. Does not bruise/bleed easily.  Psychiatric/Behavioral:  Negative for dysphoric mood. The patient is not nervous/anxious.     Objective:  BP 122/70   Pulse (!) 103   Temp 97.7 F (36.5 C) (Oral)   Ht 5' 8 (1.727 m)   Wt 175 lb 8 oz (79.6 kg)   SpO2 94%   BMI 26.68 kg/m   Wt Readings from Last 3 Encounters:  02/29/24 175 lb 8 oz (79.6 kg)  08/11/23 171 lb 2 oz (77.6 kg)  03/02/23 168 lb (76.2 kg)      Physical Exam Vitals and nursing note reviewed.  Constitutional:      General: He is not in acute distress.    Appearance: Normal appearance. He is well-developed. He is not ill-appearing.  HENT:     Head: Normocephalic and atraumatic.     Right Ear: Hearing, tympanic membrane, ear canal and external ear normal.     Left Ear: Hearing, tympanic membrane, ear canal and external ear normal.     Mouth/Throat:     Mouth: Mucous membranes are moist.     Pharynx: Oropharynx is clear. No oropharyngeal exudate or posterior oropharyngeal erythema.  Eyes:     General: No scleral icterus.    Extraocular Movements: Extraocular movements intact.     Conjunctiva/sclera: Conjunctivae normal.     Pupils: Pupils are equal, round, and reactive to light.  Neck:     Thyroid : No thyroid  mass or thyromegaly.     Vascular: No carotid bruit.  Cardiovascular:     Rate and Rhythm: Normal rate and regular rhythm.     Pulses: Normal pulses.          Radial pulses are 2+ on the right side and 2+ on the left side.     Heart sounds: Normal heart sounds. No murmur heard. Pulmonary:     Effort: Pulmonary effort is normal. No respiratory distress.      Breath sounds: Normal breath sounds. No wheezing, rhonchi or rales.  Abdominal:     General: Bowel sounds are normal. There is no distension.     Palpations: Abdomen is soft. There is no mass.     Tenderness: There is no abdominal tenderness. There is no guarding or rebound.     Hernia: No hernia is present.  Musculoskeletal:        General: Normal range of motion.     Cervical back: Normal range of  motion and neck supple.     Right lower leg: No edema.     Left lower leg: No edema.  Lymphadenopathy:     Cervical: No cervical adenopathy.  Skin:    General: Skin is warm and dry.     Findings: No rash.  Neurological:     General: No focal deficit present.     Mental Status: He is alert and oriented to person, place, and time.  Psychiatric:        Mood and Affect: Mood normal.        Behavior: Behavior normal.        Thought Content: Thought content normal.        Judgment: Judgment normal.       Results for orders placed or performed in visit on 02/22/24  CBC with Differential   Collection Time: 02/22/24 12:43 PM  Result Value Ref Range   WBC 8.4 3.8 - 10.8 Thousand/uL   RBC 5.46 4.20 - 5.80 Million/uL   Hemoglobin 16.8 13.2 - 17.1 g/dL   HCT 51.6 61.4 - 49.9 %   MCV 88.5 80.0 - 100.0 fL   MCH 30.8 27.0 - 33.0 pg   MCHC 34.8 32.0 - 36.0 g/dL   RDW 86.9 88.9 - 84.9 %   Platelets 396 140 - 400 Thousand/uL   MPV 10.1 7.5 - 12.5 fL   Neutro Abs 5,368 1,500 - 7,800 cells/uL   Absolute Lymphocytes 1,873 850 - 3,900 cells/uL   Absolute Monocytes 865 200 - 950 cells/uL   Eosinophils Absolute 210 15 - 500 cells/uL   Basophils Absolute 84 0 - 200 cells/uL   Neutrophils Relative % 63.9 %   Total Lymphocyte 22.3 %   Monocytes Relative 10.3 %   Eosinophils Relative 2.5 %   Basophils Relative 1.0 %    Assessment & Plan:   Problem List Items Addressed This Visit     Advanced care planning/counseling discussion (Chronic)   Previously discussed      Health maintenance  examination - Primary (Chronic)   Preventative protocols reviewed and updated unless pt declined. Discussed healthy diet and lifestyle.       Hereditary spherocytosis   CBC WNL.       Asplenia after surgical procedure   Prevnar-20 today.  To check with pharmacy on meningococcal (rec q5 yrs).       Dyslipidemia   Chronic off medication. Reviewed ASCVD risk.  Reviewed diet choices to improve cholesterol control.  Discussed cardiac CT with calcium  score - will order. The 10-year ASCVD risk score (Arnett DK, et al., 2019) is: 20.9%   Values used to calculate the score:     Age: 25 years     Clincally relevant sex: Male     Is Non-Hispanic African American: No     Diabetic: No     Tobacco smoker: No     Systolic Blood Pressure: 122 mmHg     Is BP treated: No     HDL Cholesterol: 41.7 mg/dL     Total Cholesterol: 212 mg/dL       Relevant Orders   CT CARDIAC SCORING (SELF PAY ONLY)   Other Visit Diagnoses       Need for vaccination against Streptococcus pneumoniae       Relevant Orders   Pneumococcal conjugate vaccine 20-valent (Prevnar 20) (Completed)        No orders of the defined types were placed in this encounter.   Orders Placed This Encounter  Procedures  CT CARDIAC SCORING (SELF PAY ONLY)    Standing Status:   Future    Expiration Date:   02/28/2025    Preferred imaging location?:   [facility]   Pneumococcal conjugate vaccine 20-valent (Prevnar 20)    Patient Instructions  Prevnar-20 pneumonia shot today  I will order heart CT with calcium  score to . Call centralized scheduling at [place] to schedule appointment: [phone].  Good to see you today.  Return as needed or in 1 year for next physical/wellness visit.   Follow up plan: Return in about 1 year (around 02/28/2025), or if symptoms worsen or fail to improve, for medicare wellness visit, annual exam, prior fasting for blood work.  Anton Blas, MD

## 2024-02-29 NOTE — Assessment & Plan Note (Signed)
 Previously discussed.

## 2024-02-29 NOTE — Patient Instructions (Addendum)
 Prevnar-20 pneumonia shot today  I will order heart CT with calcium  score to [place]. Call centralized scheduling at [place] to schedule appointment: [phone].  Good to see you today.  Return as needed or in 1 year for next physical/wellness visit.

## 2024-02-29 NOTE — Assessment & Plan Note (Signed)
 Chronic off medication. Reviewed ASCVD risk.  Reviewed diet choices to improve cholesterol control.  Discussed cardiac CT with calcium  score - will order. The 10-year ASCVD risk score (Arnett DK, et al., 2019) is: 20.9%   Values used to calculate the score:     Age: 73 years     Clincally relevant sex: Male     Is Non-Hispanic African American: No     Diabetic: No     Tobacco smoker: No     Systolic Blood Pressure: 122 mmHg     Is BP treated: No     HDL Cholesterol: 41.7 mg/dL     Total Cholesterol: 212 mg/dL

## 2024-02-29 NOTE — Assessment & Plan Note (Addendum)
 Prevnar-20 today.  To check with pharmacy on meningococcal (rec q5 yrs).

## 2024-03-02 ENCOUNTER — Ambulatory Visit (INDEPENDENT_AMBULATORY_CARE_PROVIDER_SITE_OTHER): Payer: Medicare HMO

## 2024-03-02 VITALS — Ht 68.0 in | Wt 175.0 lb

## 2024-03-02 DIAGNOSIS — Z0000 Encounter for general adult medical examination without abnormal findings: Secondary | ICD-10-CM

## 2024-03-02 DIAGNOSIS — Z1211 Encounter for screening for malignant neoplasm of colon: Secondary | ICD-10-CM

## 2024-03-02 NOTE — Patient Instructions (Addendum)
 Dustin Cole,  Thank you for taking the time for your Medicare Wellness Visit. I appreciate your continued commitment to your health goals. Please review the care plan we discussed, and feel free to reach out if I can assist you further.  Please note that Annual Wellness Visits do not include a physical exam. Some assessments may be limited, especially if the visit was conducted virtually. If needed, we may recommend an in-person follow-up with your provider.  Ongoing Care Seeing your primary care provider every 3 to 6 months helps us  monitor your health and provide consistent, personalized care.   Referrals If a referral was made during today's visit and you haven't received any updates within two weeks, please contact the referred provider directly to check on the status.  An order has been placed for a Cologuard for you. They will mail you the kit with instructions on how to obtain the sample and send it back in to be tested. If you do not received your kit, please call our office and let us  know.    Recommended Screenings:  Health Maintenance  Topic Date Due   Meningitis B Vaccine (1 of 4 - Increased Risk) Never done   Zoster (Shingles) Vaccine (1 of 2) 10/20/2001   Flu Shot  11/12/2023   COVID-19 Vaccine (1 - 2025-26 season) Never done   DTaP/Tdap/Td vaccine (2 - Td or Tdap) 12/23/2023   Cologuard (Stool DNA test)  01/18/2024   Medicare Annual Wellness Visit  03/01/2024   Pneumococcal Vaccine for age over 61  Completed   Hepatitis C Screening  Completed       03/02/2024    3:15 PM  Advanced Directives  Does Patient Have a Medical Advance Directive? Yes  Type of Estate Agent of [place];Living will  Copy of Healthcare Power of Attorney in Chart? No - copy requested    Vision: Annual vision screenings are recommended for early detection of glaucoma, cataracts, and diabetic retinopathy. These exams can also reveal signs of chronic conditions such as  diabetes and high blood pressure.  Dental: Annual dental screenings help detect early signs of oral cancer, gum disease, and other conditions linked to overall health, including heart disease and diabetes.

## 2024-03-02 NOTE — Progress Notes (Signed)
 Please attest and cosign this visit due to patients primary care provider not being in the office at the time the visit was completed.    Chief Complaint  Patient presents with   Medicare Wellness     Subjective:   VIR WHETSTINE is a 73 y.o. male who presents for a Medicare Annual Wellness Visit.  Allergies (verified) Patient has no known allergies.   History: Past Medical History:  Diagnosis Date   CAP (community acquired pneumonia) 08/11/2023   Not confirmed on CXR     Hereditary spherocytosis    s/p splenectomy as teen   History of chicken pox    Past Surgical History:  Procedure Laterality Date   COLONOSCOPY  2007   WNL per prior PCP records   COLONOSCOPY  06/2021   fair colon prep, TA, SSPx2, diverticulosis, rpt 3 yrs with 2d prep Gwen Dr [place])   SPLENECTOMY  1970s   congenital spherocytosis   Family History  Problem Relation Age of Onset   Cancer Mother        breast   Clotting disorder Brother        ?hereditary spherocytosis   Cancer Father 85       prostate   CAD Father 24       CABG   Stroke Neg Hx    Diabetes Neg Hx    Hypertension Neg Hx    Social History   Occupational History   Not on file  Tobacco Use   Smoking status: Never   Smokeless tobacco: Never  Vaping Use   Vaping status: Never Used  Substance and Sexual Activity   Alcohol use: No    Alcohol/week: 0.0 standard drinks of alcohol   Drug use: No   Sexual activity: Yes   Tobacco Counseling Counseling given: Not Answered  SDOH Screenings   Food Insecurity: No Food Insecurity (03/02/2024)  Housing: Unknown (03/02/2024)  Transportation Needs: No Transportation Needs (03/02/2024)  Utilities: Not At Risk (03/02/2024)  Alcohol Screen: Low Risk  (03/02/2024)  Depression (PHQ2-9): Low Risk  (03/02/2024)  Financial Resource Strain: Low Risk  (03/02/2024)  Physical Activity: Sufficiently Active (03/02/2024)  Social Connections: Moderately Integrated (03/02/2024)  Stress: No  Stress Concern Present (03/02/2024)  Tobacco Use: Low Risk  (03/02/2024)  Health Literacy: Adequate Health Literacy (03/02/2024)   See flowsheets for full screening details  Depression Screen PHQ 2 & 9 Depression Scale- Over the past 2 weeks, how often have you been bothered by any of the following problems? Little interest or pleasure in doing things: 0 Feeling down, depressed, or hopeless (PHQ Adolescent also includes...irritable): 0 PHQ-2 Total Score: 0 Trouble falling or staying asleep, or sleeping too much: 0 Feeling tired or having little energy: 0 Poor appetite or overeating (PHQ Adolescent also includes...weight loss): 0 Feeling bad about yourself - or that you are a failure or have let yourself or your family down: 0 Trouble concentrating on things, such as reading the newspaper or watching television (PHQ Adolescent also includes...like school work): 0 Moving or speaking so slowly that other people could have noticed. Or the opposite - being so fidgety or restless that you have been moving around a lot more than usual: 0 Thoughts that you would be better off dead, or of hurting yourself in some way: 0 PHQ-9 Total Score: 0 If you checked off any problems, how difficult have these problems made it for you to do your work, take care of things at home, or get along with  other people?: Not difficult at all     Goals Addressed             This Visit's Progress    lose weight abnd exercise more         Visit info / Clinical Intake: Medicare Wellness Visit Type:: Subsequent Annual Wellness Visit Persons participating in visit:: patient Medicare Wellness Visit Mode:: Telephone If telephone:: video declined Because this visit was a virtual/telehealth visit:: unable to obtan vitals due to lack of equipment If Telephone or Video please confirm:: I discussed the limitations of evaluation and management by telemedicine Patient Location:: in car at [facility] Provider Location::  clinic Information given by:: patient Interpreter Needed?: No Pre-visit prep was completed: yes AWV questionnaire completed by patient prior to visit?: no Living arrangements:: lives with spouse/significant other Patient's Overall Health Status Rating: good Typical amount of pain: none Does pain affect daily life?: no Are you currently prescribed opioids?: no  Dietary Habits and Nutritional Risks How many meals a day?: 2 Eats fruit and vegetables daily?: yes Most meals are obtained by: preparing own meals; eating out In the last 2 weeks, have you had any of the following?: none Diabetic:: no  Functional Status Activities of Daily Living (to include ambulation/medication): Independent Ambulation: Independent Medication Administration: Independent Home Management: Independent Manage your own finances?: yes Primary transportation is: driving Concerns about vision?: no *vision screening is required for WTM* Concerns about hearing?: no  Fall Screening Falls in the past year?: 0 Number of falls in past year: 0 Was there an injury with Fall?: 0 Fall Risk Category Calculator: 0 Patient Fall Risk Level: Low Fall Risk  Fall Risk Patient at Risk for Falls Due to: No Fall Risks Fall risk Follow up: Education provided; Falls prevention discussed  Home and Transportation Safety: All rugs have non-skid backing?: N/A, no rugs All stairs or steps have railings?: N/A, no stairs Grab bars in the bathtub or shower?: yes Have non-skid surface in bathtub or shower?: yes Good home lighting?: yes Regular seat belt use?: yes Hospital stays in the last year:: no  Cognitive Assessment Difficulty concentrating, remembering, or making decisions? : no Will 6CIT or Mini Cog be Completed: yes Give patient an address phrase to remember (5 components): [address] [place]   Advance Directives (For Healthcare) Does Patient Have a Medical Advance Directive?: Yes Type of Advance  Directive: Healthcare Power of [place]; Living will Copy of Healthcare Power of Attorney in Chart?: No - copy requested Copy of Living Will in Chart?: No - copy requested  Reviewed/Updated  Reviewed/Updated: Reviewed All (Medical, Surgical, Family, Medications, Allergies, Care Teams, Patient Goals)       Objective:    Today's Vitals   03/02/24 1500  Weight: 175 lb (79.4 kg)  Height: 5' 8 (1.727 m)   Body mass index is 26.61 kg/m.  Current Medications (verified) Outpatient Encounter Medications as of 03/02/2024  Medication Sig   ascorbic acid (VITAMIN C) 1000 MG tablet 1 tablet Orally Once a day; Duration: 30 day(s)   aspirin 81 MG tablet Take 81 mg by mouth daily.   Multiple Vitamin (MULTIVITAMIN) tablet Take 1 tablet by mouth daily.   Multiple Vitamins-Minerals (PRESERVISION AREDS PO) Take by mouth daily.   sildenafil  (REVATIO ) 20 MG tablet TAKE TWO TO FIVE TABLETS BY MOUTH ONCE DAILY AS NEEDED FOR RELATIONS   No facility-administered encounter medications on file as of 03/02/2024.   Hearing/Vision screen Vision Screening - Comments:: UTD w/visits to Dr Carolee Immunizations and  Health Maintenance Health Maintenance  Topic Date Due   Meningococcal B Vaccine (1 of 4 - Increased Risk) Never done   Zoster Vaccines- Shingrix (1 of 2) 10/20/2001   Influenza Vaccine  11/12/2023   COVID-19 Vaccine (1 - 2025-26 season) Never done   DTaP/Tdap/Td (2 - Td or Tdap) 12/23/2023   Fecal DNA (Cologuard)  01/18/2024   Medicare Annual Wellness (AWV)  03/01/2024   Pneumococcal Vaccine: 50+ Years  Completed   Hepatitis C Screening  Completed        Assessment/Plan:  This is a routine wellness examination for Yichen.  Patient Care Team: Rilla Baller, MD as PCP - General (Family Medicine) [facility]  I have personally reviewed and noted the following in the patient's chart:   Medical and social history Use of alcohol, tobacco or illicit drugs  Current  medications and supplements including opioid prescriptions. Functional ability and status Nutritional status Physical activity Advanced directives List of other physicians Hospitalizations, surgeries, and ER visits in previous 12 months Vitals Screenings to include cognitive, depression, and falls Referrals and appointments  No orders of the defined types were placed in this encounter.  Pt declines Influenza vaccine and relays he will have cxoloscopy in April 2026 In addition, I have reviewed and discussed with patient certain preventive protocols, quality metrics, and best practice recommendations. A written personalized care plan for preventive services as well as general preventive health recommendations were provided to patient.   Erminio LITTIE Saris, LPN   [id number]    After Visit Summary: (Declined) Due to this being a telephonic visit, with patients personalized plan was offered to patient but patient Declined AVS at this time   Nurse Notes: Pt has no concerns or questions. AWV/CPE made simultaneously for 2026

## 2024-03-15 LAB — COLOGUARD: COLOGUARD: NEGATIVE

## 2024-03-16 ENCOUNTER — Ambulatory Visit: Payer: Self-pay

## 2024-03-16 DIAGNOSIS — Z9889 Other specified postprocedural states: Secondary | ICD-10-CM | POA: Diagnosis not present

## 2024-03-16 DIAGNOSIS — H33012 Retinal detachment with single break, left eye: Secondary | ICD-10-CM | POA: Diagnosis not present

## 2024-03-16 DIAGNOSIS — T85398A Other mechanical complication of other ocular prosthetic devices, implants and grafts, initial encounter: Secondary | ICD-10-CM | POA: Diagnosis not present

## 2024-03-16 DIAGNOSIS — H35413 Lattice degeneration of retina, bilateral: Secondary | ICD-10-CM | POA: Diagnosis not present

## 2024-03-17 ENCOUNTER — Ambulatory Visit
Admission: RE | Admit: 2024-03-17 | Discharge: 2024-03-17 | Disposition: A | Discharge status: Home / Self Care | Payer: Self-pay | Source: Ambulatory Visit | Attending: Family Medicine | Admitting: Family Medicine

## 2024-03-17 DIAGNOSIS — E785 Hyperlipidemia, unspecified: Secondary | ICD-10-CM | POA: Insufficient documentation

## 2024-03-21 ENCOUNTER — Ambulatory Visit: Payer: Self-pay | Admitting: Family Medicine

## 2024-03-21 DIAGNOSIS — R931 Abnormal findings on diagnostic imaging of heart and coronary circulation: Secondary | ICD-10-CM | POA: Insufficient documentation

## 2024-03-21 MED ORDER — ATORVASTATIN CALCIUM 40 MG PO TABS: 40.0000 mg | ORAL_TABLET | Freq: Every day | ORAL | 3 refills | Status: AC

## 2025-02-26 ENCOUNTER — Other Ambulatory Visit

## 2025-03-05 ENCOUNTER — Encounter: Admitting: Family Medicine

## 2025-03-07 ENCOUNTER — Ambulatory Visit

## 2025-03-07 ENCOUNTER — Encounter: Admitting: Family Medicine
